# Patient Record
Sex: Male | Born: 1965 | Race: White | Hispanic: No | Smoking: Current every day smoker
Health system: Southern US, Community
[De-identification: ages and names within clinical notes are randomized; demographics above are authoritative.]

## PROBLEM LIST (undated history)

## (undated) ENCOUNTER — Ambulatory Visit (HOSPITAL_COMMUNITY): Admission: EM | Payer: No Typology Code available for payment source

## (undated) DIAGNOSIS — Z9889 Other specified postprocedural states: Secondary | ICD-10-CM

## (undated) DIAGNOSIS — R112 Nausea with vomiting, unspecified: Secondary | ICD-10-CM

## (undated) DIAGNOSIS — N189 Chronic kidney disease, unspecified: Secondary | ICD-10-CM

## (undated) DIAGNOSIS — N200 Calculus of kidney: Secondary | ICD-10-CM

## (undated) DIAGNOSIS — F1910 Other psychoactive substance abuse, uncomplicated: Secondary | ICD-10-CM

## (undated) HISTORY — PX: ANKLE SURGERY: SHX546

## (undated) HISTORY — PX: NOSE SURGERY: SHX723

## (undated) HISTORY — PX: HERNIA REPAIR: SHX51

---

## 1998-05-30 ENCOUNTER — Ambulatory Visit (HOSPITAL_COMMUNITY): Admission: RE | Admit: 1998-05-30 | Discharge: 1998-05-30 | Payer: Self-pay | Admitting: Unknown Physician Specialty

## 1998-07-15 ENCOUNTER — Emergency Department (HOSPITAL_COMMUNITY): Admission: EM | Admit: 1998-07-15 | Discharge: 1998-07-15 | Payer: Self-pay | Admitting: Emergency Medicine

## 1998-10-31 ENCOUNTER — Emergency Department (HOSPITAL_COMMUNITY): Admission: EM | Admit: 1998-10-31 | Discharge: 1998-10-31 | Payer: Self-pay

## 1999-05-14 ENCOUNTER — Emergency Department (HOSPITAL_COMMUNITY): Admission: EM | Admit: 1999-05-14 | Discharge: 1999-05-14 | Payer: Self-pay | Admitting: *Deleted

## 1999-10-07 ENCOUNTER — Emergency Department (HOSPITAL_COMMUNITY): Admission: EM | Admit: 1999-10-07 | Discharge: 1999-10-07 | Payer: Self-pay | Admitting: Emergency Medicine

## 1999-10-08 ENCOUNTER — Encounter: Payer: Self-pay | Admitting: Emergency Medicine

## 2001-12-06 ENCOUNTER — Emergency Department (HOSPITAL_COMMUNITY): Admission: EM | Admit: 2001-12-06 | Discharge: 2001-12-06 | Payer: Self-pay | Admitting: *Deleted

## 2001-12-07 ENCOUNTER — Encounter: Payer: Self-pay | Admitting: Emergency Medicine

## 2001-12-14 ENCOUNTER — Emergency Department (HOSPITAL_COMMUNITY): Admission: EM | Admit: 2001-12-14 | Discharge: 2001-12-14 | Payer: Self-pay

## 2001-12-22 ENCOUNTER — Emergency Department (HOSPITAL_COMMUNITY): Admission: EM | Admit: 2001-12-22 | Discharge: 2001-12-23 | Payer: Self-pay | Admitting: Emergency Medicine

## 2001-12-22 ENCOUNTER — Encounter: Payer: Self-pay | Admitting: *Deleted

## 2003-01-19 ENCOUNTER — Emergency Department (HOSPITAL_COMMUNITY): Admission: EM | Admit: 2003-01-19 | Discharge: 2003-01-19 | Payer: Self-pay

## 2003-04-01 ENCOUNTER — Emergency Department (HOSPITAL_COMMUNITY): Admission: EM | Admit: 2003-04-01 | Discharge: 2003-04-01 | Payer: Self-pay | Admitting: Emergency Medicine

## 2003-07-23 ENCOUNTER — Emergency Department (HOSPITAL_COMMUNITY): Admission: EM | Admit: 2003-07-23 | Discharge: 2003-07-23 | Payer: Self-pay | Admitting: Emergency Medicine

## 2003-09-27 ENCOUNTER — Emergency Department (HOSPITAL_COMMUNITY): Admission: EM | Admit: 2003-09-27 | Discharge: 2003-09-27 | Payer: Self-pay | Admitting: Emergency Medicine

## 2005-04-26 ENCOUNTER — Emergency Department (HOSPITAL_COMMUNITY): Admission: EM | Admit: 2005-04-26 | Discharge: 2005-04-26 | Payer: Self-pay | Admitting: Emergency Medicine

## 2005-06-29 ENCOUNTER — Emergency Department (HOSPITAL_COMMUNITY): Admission: EM | Admit: 2005-06-29 | Discharge: 2005-06-29 | Payer: Self-pay | Admitting: Emergency Medicine

## 2005-08-13 ENCOUNTER — Emergency Department (HOSPITAL_COMMUNITY): Admission: EM | Admit: 2005-08-13 | Discharge: 2005-08-13 | Payer: Self-pay | Admitting: Emergency Medicine

## 2005-09-04 ENCOUNTER — Emergency Department (HOSPITAL_COMMUNITY): Admission: EM | Admit: 2005-09-04 | Discharge: 2005-09-04 | Payer: Self-pay | Admitting: Emergency Medicine

## 2007-01-04 ENCOUNTER — Emergency Department (HOSPITAL_COMMUNITY): Admission: EM | Admit: 2007-01-04 | Discharge: 2007-01-04 | Payer: Self-pay | Admitting: Emergency Medicine

## 2008-04-28 ENCOUNTER — Emergency Department (HOSPITAL_COMMUNITY): Admission: EM | Admit: 2008-04-28 | Discharge: 2008-04-28 | Payer: Self-pay | Admitting: Emergency Medicine

## 2009-01-28 ENCOUNTER — Emergency Department (HOSPITAL_COMMUNITY): Admission: EM | Admit: 2009-01-28 | Discharge: 2009-01-28 | Payer: Self-pay | Admitting: Emergency Medicine

## 2009-03-24 ENCOUNTER — Emergency Department (HOSPITAL_COMMUNITY): Admission: EM | Admit: 2009-03-24 | Discharge: 2009-03-24 | Payer: Self-pay | Admitting: Emergency Medicine

## 2009-05-04 ENCOUNTER — Emergency Department (HOSPITAL_COMMUNITY): Admission: EM | Admit: 2009-05-04 | Discharge: 2009-05-04 | Payer: Self-pay | Admitting: Emergency Medicine

## 2009-07-01 ENCOUNTER — Emergency Department (HOSPITAL_COMMUNITY): Admission: EM | Admit: 2009-07-01 | Discharge: 2009-07-01 | Payer: Self-pay | Admitting: Emergency Medicine

## 2009-09-03 ENCOUNTER — Emergency Department (HOSPITAL_COMMUNITY): Admission: EM | Admit: 2009-09-03 | Discharge: 2009-09-03 | Payer: Self-pay | Admitting: Family Medicine

## 2009-09-03 ENCOUNTER — Emergency Department (HOSPITAL_COMMUNITY): Admission: EM | Admit: 2009-09-03 | Discharge: 2009-09-03 | Payer: Self-pay | Admitting: Emergency Medicine

## 2009-09-06 ENCOUNTER — Ambulatory Visit (HOSPITAL_COMMUNITY): Admission: RE | Admit: 2009-09-06 | Discharge: 2009-09-06 | Payer: Self-pay | Admitting: Emergency Medicine

## 2009-09-17 ENCOUNTER — Emergency Department (HOSPITAL_COMMUNITY): Admission: EM | Admit: 2009-09-17 | Discharge: 2009-09-17 | Payer: Self-pay | Admitting: Family Medicine

## 2009-09-28 ENCOUNTER — Ambulatory Visit (HOSPITAL_COMMUNITY): Admission: RE | Admit: 2009-09-28 | Discharge: 2009-09-28 | Payer: Self-pay | Admitting: Orthopaedic Surgery

## 2009-12-31 ENCOUNTER — Emergency Department (HOSPITAL_COMMUNITY): Admission: AC | Admit: 2009-12-31 | Discharge: 2009-12-31 | Payer: Self-pay

## 2010-01-07 ENCOUNTER — Emergency Department (HOSPITAL_COMMUNITY): Admission: EM | Admit: 2010-01-07 | Discharge: 2010-01-07 | Payer: Self-pay | Admitting: Family Medicine

## 2010-04-06 ENCOUNTER — Emergency Department (HOSPITAL_COMMUNITY): Admission: EM | Admit: 2010-04-06 | Discharge: 2010-04-06 | Payer: Self-pay | Admitting: Emergency Medicine

## 2010-05-07 ENCOUNTER — Emergency Department (HOSPITAL_COMMUNITY): Admission: EM | Admit: 2010-05-07 | Discharge: 2010-05-07 | Payer: Self-pay | Admitting: Emergency Medicine

## 2010-07-07 ENCOUNTER — Emergency Department (HOSPITAL_COMMUNITY): Admission: EM | Admit: 2010-07-07 | Discharge: 2010-07-07 | Payer: Self-pay | Admitting: Family Medicine

## 2010-07-20 ENCOUNTER — Emergency Department (HOSPITAL_COMMUNITY): Admission: EM | Admit: 2010-07-20 | Discharge: 2010-07-20 | Payer: Self-pay | Admitting: Family Medicine

## 2010-11-19 IMAGING — CT CT PELVIS W/O CM
2 of 4 series · 17 of 46 positions shown, 19 images · non-contrast
Comparison: None

CT ABDOMEN

CLINICAL DATA: *Abdominal pain; left lower quadrant pain

CT ABDOMEN AND PELVIS
TECHNIQUE: Multidetector CT imaging of the abdomen was performed
following the standard protocol without IV contrast.,Technique:
Multidetector CT imaging of the pelvis was performed following the
standard protocol without intravenous contrast.

[Series 2: a/p w/o 5.0 b31f st · axial · non-contrast · 0.76mm/px · z∈[+840,+1266]mm · 14 of 93 slices shown, 16 images]
[im 4/93  soft-tissue]
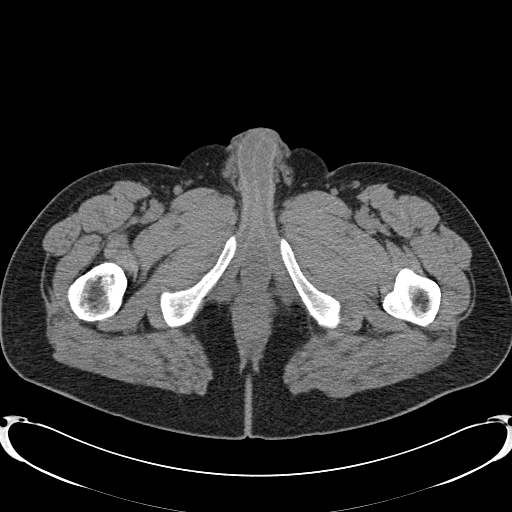
[im 4/93  bone]
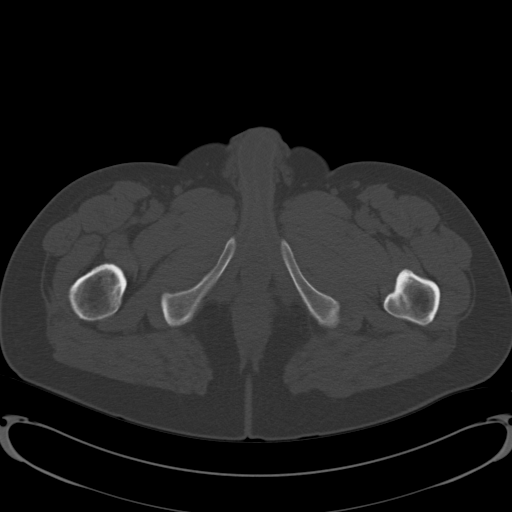
[im 12/93  soft-tissue]
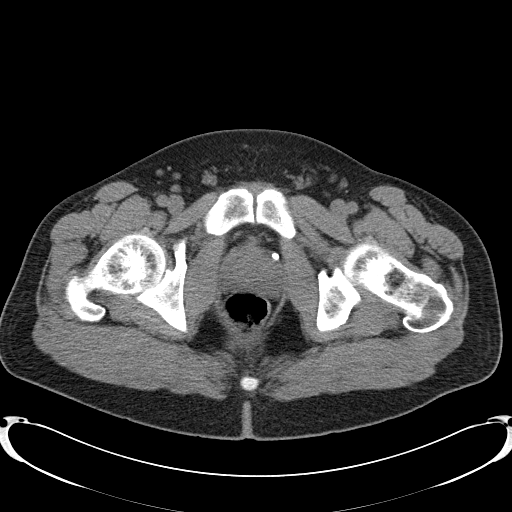
[im 19/93  soft-tissue]
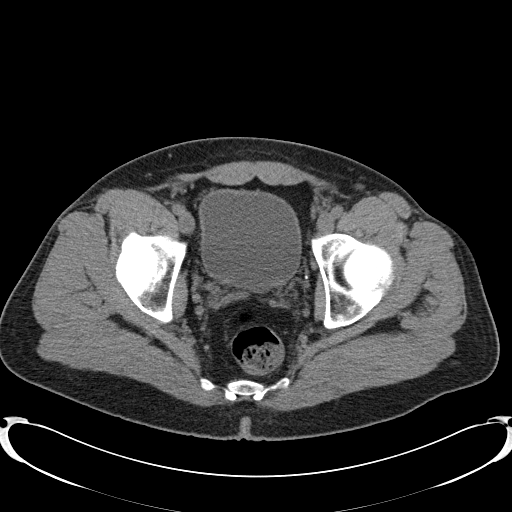
[im 26/93  soft-tissue]
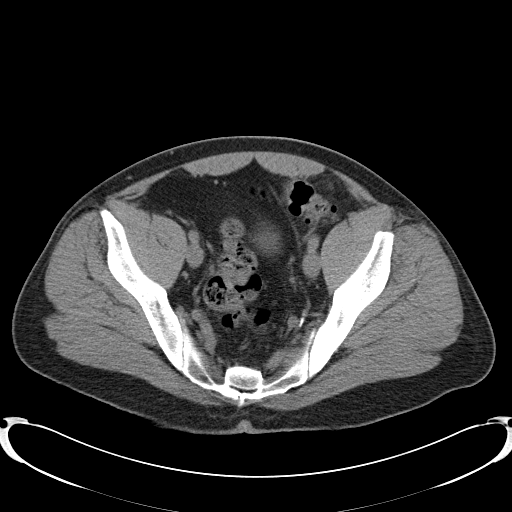
[im 30/93  soft-tissue]
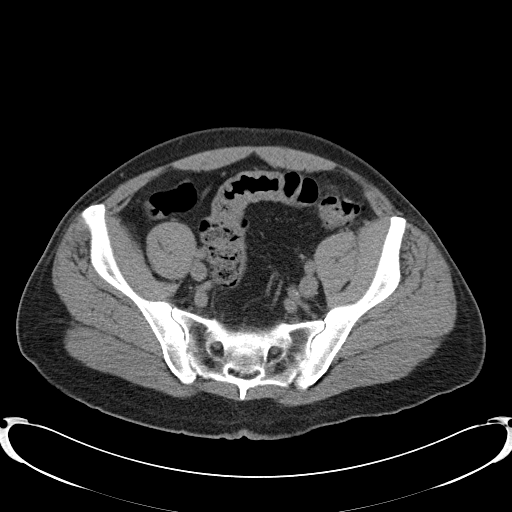
[im 37/93  soft-tissue]
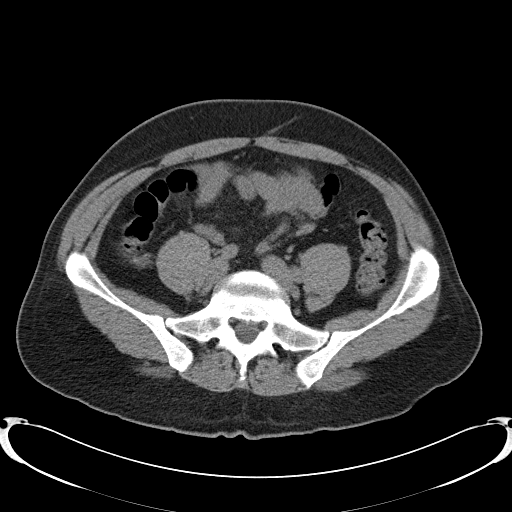
[im 45/93  soft-tissue]
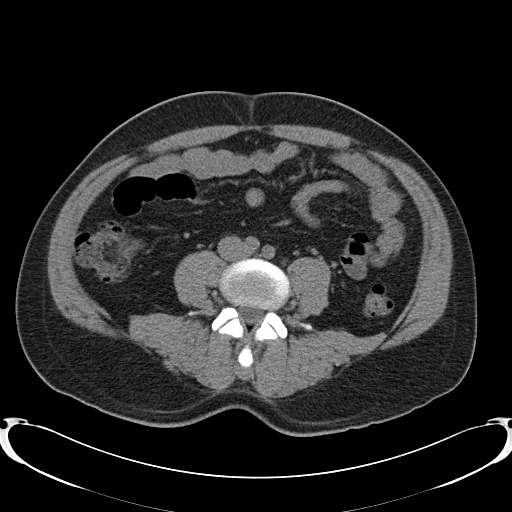
[im 48/93  soft-tissue]
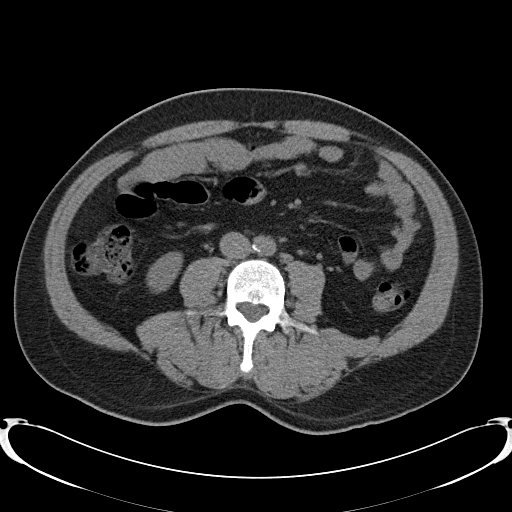
[im 56/93  soft-tissue]
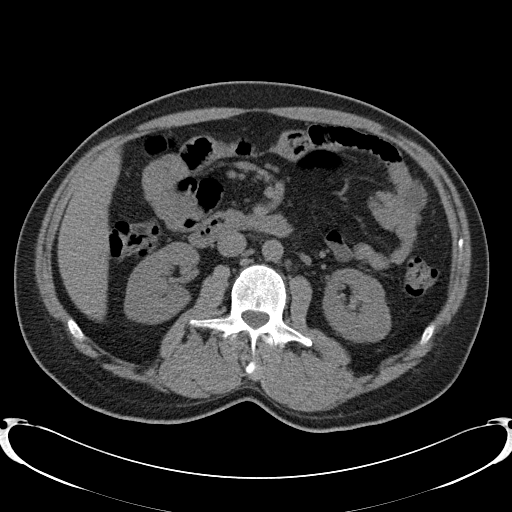
[im 56/93  bone]
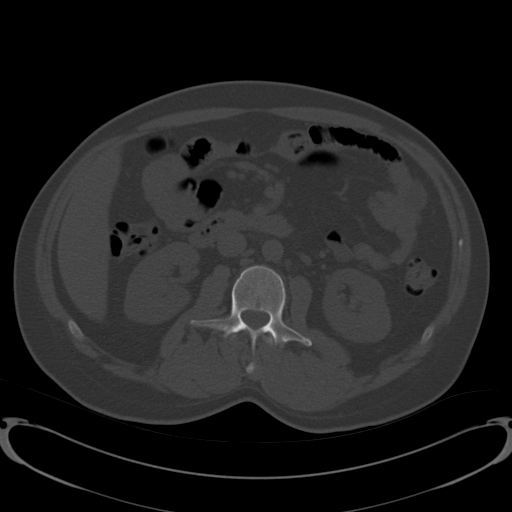
[im 63/93  soft-tissue]
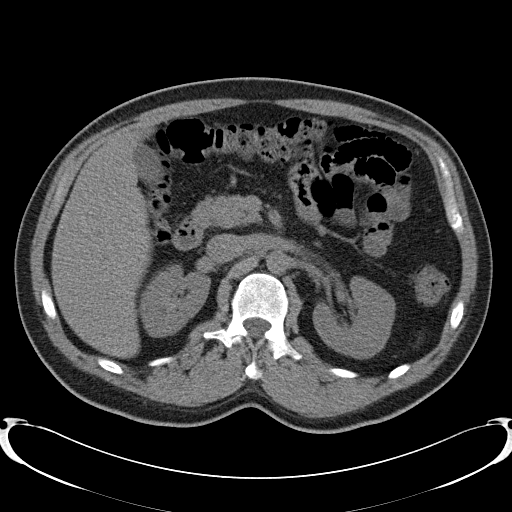
[im 70/93  soft-tissue]
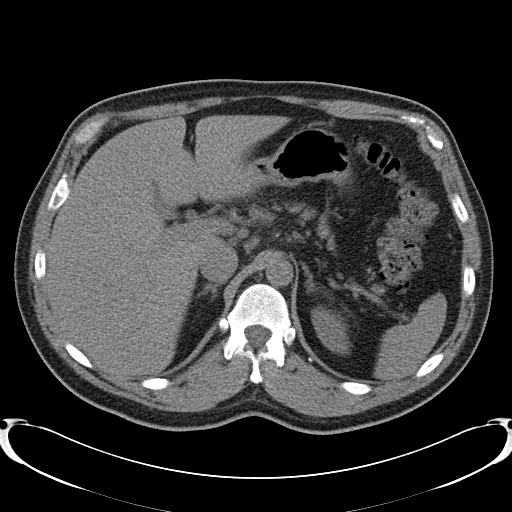
[im 74/93  soft-tissue]
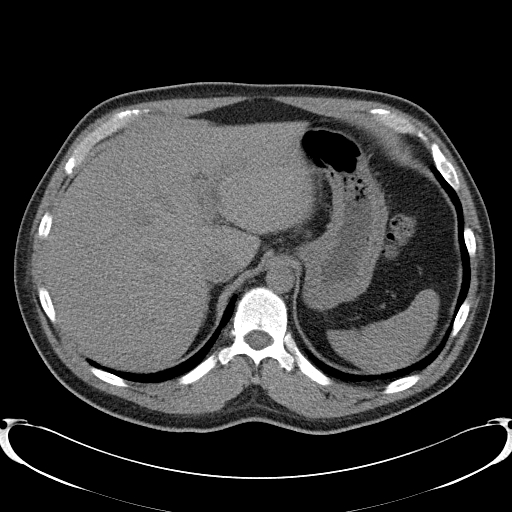
[im 81/93  soft-tissue]
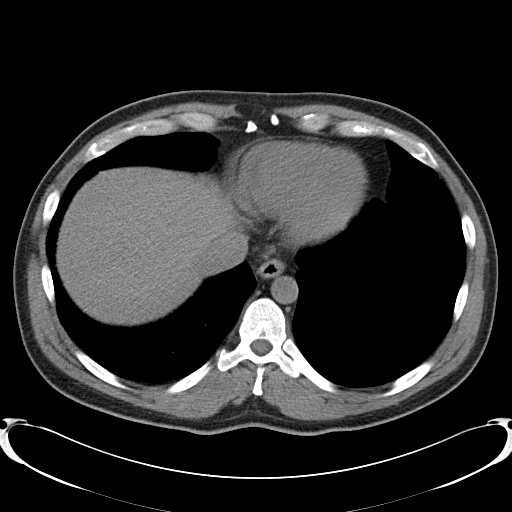
[im 89/93  soft-tissue]
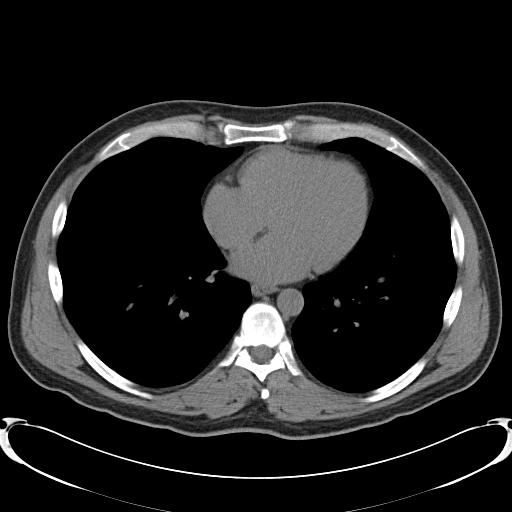

[Series 602: coronal abd/pel · coronal · 0.91mm/px · 3 of 82 slices shown]
[im 28/82  soft-tissue]
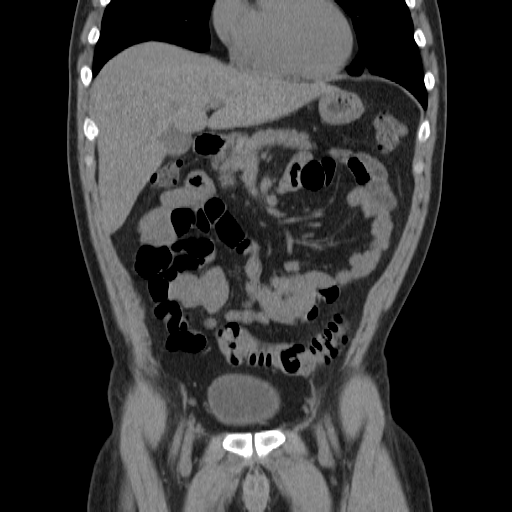
[im 37/82  soft-tissue]
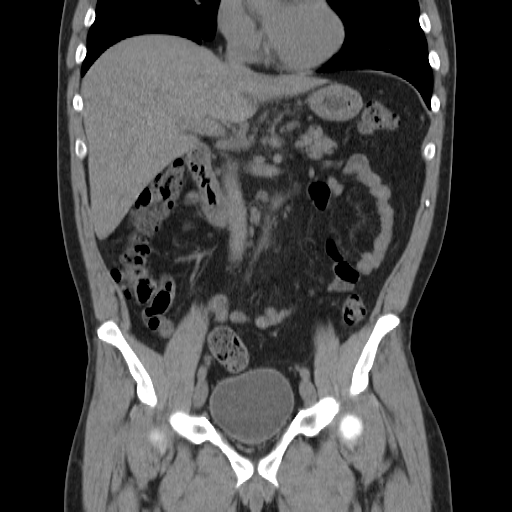
[im 46/82  soft-tissue]
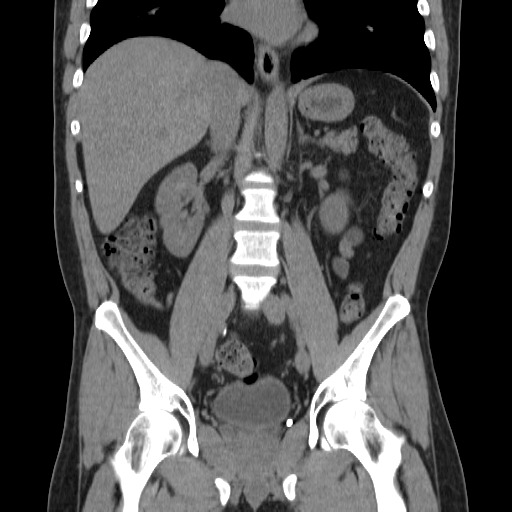

[17 of 46 positions shown; findings below may reference images not displayed]

FINDINGS: No destructive bony lesions are noted.  Mild disc space
flattening noted at L5, S1 level.

Lung bases are unremarkable.

Unenhanced liver, spleen, pancreas and adrenals are unremarkable.
No calcified gallstones are noted within gallbladder.  No bowel
obstruction.  No ascites or free air.  No adenopathy.

Unenhanced kidney are symmetrical in size.  No nephrolithiasis.  No
hydronephrosis or hydroureter.

Mild atherosclerotic calcifications noted right iliac artery
origin.
IMPRESSION: 1.  No nephrolithiasis.  No hydronephrosis or hydroureter.
2.  No bowel obstruction.  No ascites or free air.

CT PELVIS
FINDINGS: In axial image 65 there is mild stranding of the
mesenteric fat anterior to the left colon in the left lower
quadrant.  Findings are highly suspicious for epiploic appendagitis
or focal panniculitis.  Less likely diverticulitis.  No mesenteric
abscess or fluid collection. The urinary bladder is unremarkable.
Pelvic phleboliths are noted. Normal appendix is noted in axial
image 56
IMPRESSION: 1. Findings suspicious for epiploic appendagitis or focal
panniculitis in left lower quadrant mesentery anterior to the left
colon.  No mesenteric abscess or fluid collection.
2.  Unremarkable urinary bladder.

## 2011-01-26 LAB — CBC
HCT: 48.1 % (ref 39.0–52.0)
MCV: 97.1 fL (ref 78.0–100.0)
Platelets: 296 10*3/uL (ref 150–400)
RDW: 13.8 % (ref 11.5–15.5)
WBC: 11.7 10*3/uL — ABNORMAL HIGH (ref 4.0–10.5)

## 2011-01-26 LAB — TYPE AND SCREEN: ABO/RH(D): A POS

## 2011-01-26 LAB — POCT CARDIAC MARKERS: Myoglobin, poc: 251 ng/mL (ref 12–200)

## 2011-01-26 LAB — COMPREHENSIVE METABOLIC PANEL
ALT: 22 U/L (ref 0–53)
Albumin: 4 g/dL (ref 3.5–5.2)
Alkaline Phosphatase: 74 U/L (ref 39–117)
Chloride: 103 mEq/L (ref 96–112)
GFR calc Af Amer: >60 mL/min (ref >60)
GFR calc non Af Amer: >60 mL/min (ref >60)
Total Protein: 7 g/dL (ref 6.0–8.3)

## 2011-01-26 LAB — POCT I-STAT, CHEM 8
Calcium, Ion: 1.02 mmol/L — ABNORMAL LOW (ref 1.12–1.32)
HCT: 50 % (ref 39.0–52.0)
Hemoglobin: 17 g/dL (ref 13.0–17.0)
Sodium: 139 meq/L (ref 135–145)
TCO2: 24 mmol/L (ref 0–100)

## 2011-01-26 LAB — LACTIC ACID, PLASMA: Lactic Acid, Venous: 3.7 mmol/L — ABNORMAL HIGH (ref 0.5–2.2)

## 2011-01-26 LAB — APTT: aPTT: 26 seconds (ref 24–37)

## 2011-02-04 LAB — URINALYSIS, ROUTINE W REFLEX MICROSCOPIC
Bilirubin Urine: NEGATIVE
Hgb urine dipstick: NEGATIVE
Specific Gravity, Urine: >1.046 — ABNORMAL HIGH (ref 1.005–1.030)
Urobilinogen, UA: 0.2 mg/dL (ref 0.0–1.0)

## 2011-02-04 LAB — POCT I-STAT, CHEM 8
Calcium, Ion: 1.15 mmol/L (ref 1.12–1.32)
Creatinine, Ser: 1 mg/dL (ref 0.4–1.5)
Glucose, Bld: 88 mg/dL (ref 70–99)
Hemoglobin: 16.7 g/dL (ref 13.0–17.0)
Potassium: 4.2 mEq/L (ref 3.5–5.1)

## 2011-02-04 LAB — CBC
HCT: 46.3 % (ref 39.0–52.0)
Hemoglobin: 16.1 g/dL (ref 13.0–17.0)
MCHC: 34.7 g/dL (ref 30.0–36.0)
MCV: 95.7 fL (ref 78.0–100.0)
RBC: 4.84 MIL/uL (ref 4.22–5.81)

## 2011-02-04 LAB — DIFFERENTIAL
Basophils Relative: 1 % (ref 0–1)
Eosinophils Absolute: 0.3 10*3/uL (ref 0.0–0.7)
Eosinophils Relative: 3 % (ref 0–5)
Monocytes Absolute: 0.8 10*3/uL (ref 0.1–1.0)
Monocytes Relative: 9 % (ref 3–12)
Neutro Abs: 4.7 10*3/uL (ref 1.7–7.7)

## 2011-02-04 LAB — URINE CULTURE: Colony Count: NO GROWTH

## 2011-02-04 LAB — GIARDIA/CRYPTOSPORIDIUM SCREEN(EIA): Giardia Screen - EIA: NEGATIVE

## 2011-02-04 LAB — CLOSTRIDIUM DIFFICILE EIA

## 2011-02-04 LAB — STOOL CULTURE

## 2011-02-07 LAB — URINALYSIS, ROUTINE W REFLEX MICROSCOPIC
Ketones, ur: NEGATIVE mg/dL
Nitrite: NEGATIVE
Protein, ur: NEGATIVE mg/dL

## 2013-11-28 ENCOUNTER — Ambulatory Visit (HOSPITAL_COMMUNITY): Payer: Worker's Compensation | Admitting: Certified Registered Nurse Anesthetist

## 2013-11-28 ENCOUNTER — Encounter (HOSPITAL_COMMUNITY): Payer: Self-pay | Admitting: *Deleted

## 2013-11-28 ENCOUNTER — Inpatient Hospital Stay (HOSPITAL_COMMUNITY)
Admission: AD | Admit: 2013-11-28 | Discharge: 2013-11-30 | DRG: 514 | Disposition: A | Discharge status: Home / Self Care | Payer: Worker's Compensation | Source: Ambulatory Visit | Attending: Orthopedic Surgery | Admitting: Orthopedic Surgery

## 2013-11-28 ENCOUNTER — Encounter (HOSPITAL_COMMUNITY)
Admission: AD | Disposition: A | Discharge status: Home / Self Care | Payer: Self-pay | Source: Ambulatory Visit | Attending: Orthopedic Surgery

## 2013-11-28 ENCOUNTER — Encounter (HOSPITAL_COMMUNITY): Payer: Worker's Compensation | Admitting: Certified Registered Nurse Anesthetist

## 2013-11-28 DIAGNOSIS — W540XXA Bitten by dog, initial encounter: Secondary | ICD-10-CM | POA: Diagnosis present

## 2013-11-28 DIAGNOSIS — Z87891 Personal history of nicotine dependence: Secondary | ICD-10-CM

## 2013-11-28 DIAGNOSIS — S61209A Unspecified open wound of unspecified finger without damage to nail, initial encounter: Secondary | ICD-10-CM | POA: Diagnosis present

## 2013-11-28 DIAGNOSIS — M659 Unspecified synovitis and tenosynovitis, unspecified site: Principal | ICD-10-CM | POA: Diagnosis present

## 2013-11-28 DIAGNOSIS — S61409A Unspecified open wound of unspecified hand, initial encounter: Secondary | ICD-10-CM | POA: Diagnosis present

## 2013-11-28 DIAGNOSIS — Y9289 Other specified places as the place of occurrence of the external cause: Secondary | ICD-10-CM

## 2013-11-28 DIAGNOSIS — Z23 Encounter for immunization: Secondary | ICD-10-CM

## 2013-11-28 DIAGNOSIS — Y990 Civilian activity done for income or pay: Secondary | ICD-10-CM

## 2013-11-28 DIAGNOSIS — S61459A Open bite of unspecified hand, initial encounter: Secondary | ICD-10-CM | POA: Diagnosis present

## 2013-11-28 HISTORY — DX: Nausea with vomiting, unspecified: R11.2

## 2013-11-28 HISTORY — DX: Other specified postprocedural states: Z98.890

## 2013-11-28 HISTORY — DX: Calculus of kidney: N20.0

## 2013-11-28 HISTORY — PX: I & D EXTREMITY: SHX5045

## 2013-11-28 HISTORY — DX: Chronic kidney disease, unspecified: N18.9

## 2013-11-28 LAB — CBC
HEMATOCRIT: 45.9 % (ref 39.0–52.0)
HEMOGLOBIN: 16 g/dL (ref 13.0–17.0)
MCH: 31.5 pg (ref 26.0–34.0)
MCHC: 34.9 g/dL (ref 30.0–36.0)
MCV: 90.4 fL (ref 78.0–100.0)
PLATELETS: 336 10*3/uL (ref 150–400)
RBC: 5.08 MIL/uL (ref 4.22–5.81)
RDW: 12.9 % (ref 11.5–15.5)
WBC: 8 10*3/uL (ref 4.0–10.5)

## 2013-11-28 SURGERY — IRRIGATION AND DEBRIDEMENT EXTREMITY
Anesthesia: General | Site: Hand | Laterality: Left

## 2013-11-28 MED ORDER — ONDANSETRON HCL 4 MG/2ML IJ SOLN
INTRAMUSCULAR | Status: AC
Start: 2013-11-28 — End: 2013-11-28
  Filled 2013-11-28: qty 2

## 2013-11-28 MED ORDER — PROMETHAZINE HCL 25 MG RE SUPP: 12.5000 mg | Freq: Four times a day (QID) | RECTAL | Status: DC | PRN

## 2013-11-28 MED ORDER — LIDOCAINE HCL (CARDIAC) 20 MG/ML IV SOLN
INTRAVENOUS | Status: DC | PRN
  Administered 2013-11-28: 100 mg via INTRAVENOUS

## 2013-11-28 MED ORDER — FENTANYL CITRATE 0.05 MG/ML IJ SOLN
25.0000 ug | INTRAMUSCULAR | Status: AC | PRN
  Administered 2013-11-28 (×3): 25 ug via INTRAVENOUS
  Administered 2013-11-28: 50 ug via INTRAVENOUS
  Administered 2013-11-28 (×2): 25 ug via INTRAVENOUS

## 2013-11-28 MED ORDER — METHOCARBAMOL 500 MG PO TABS
500.0000 mg | ORAL_TABLET | Freq: Four times a day (QID) | ORAL | Status: DC | PRN
  Administered 2013-11-28 – 2013-11-30 (×4): 500 mg via ORAL
  Filled 2013-11-28 (×3): qty 1

## 2013-11-28 MED ORDER — PROPOFOL 10 MG/ML IV BOLUS
INTRAVENOUS | Status: DC | PRN
  Administered 2013-11-28: 200 mg via INTRAVENOUS

## 2013-11-28 MED ORDER — FENTANYL CITRATE 0.05 MG/ML IJ SOLN
INTRAMUSCULAR | Status: AC
  Filled 2013-11-28: qty 5

## 2013-11-28 MED ORDER — POTASSIUM CHLORIDE 2 MEQ/ML IV SOLN
INTRAVENOUS | Status: DC
  Administered 2013-11-28: 23:00:00 via INTRAVENOUS
  Filled 2013-11-28 (×5): qty 1000

## 2013-11-28 MED ORDER — DEXAMETHASONE SODIUM PHOSPHATE 10 MG/ML IJ SOLN
INTRAMUSCULAR | Status: DC | PRN
  Administered 2013-11-28: 8 mg via INTRAVENOUS

## 2013-11-28 MED ORDER — SUCCINYLCHOLINE CHLORIDE 20 MG/ML IJ SOLN
INTRAMUSCULAR | Status: DC | PRN
  Administered 2013-11-28: 100 mg via INTRAVENOUS

## 2013-11-28 MED ORDER — ONDANSETRON HCL 4 MG/2ML IJ SOLN: 4.0000 mg | Freq: Four times a day (QID) | INTRAMUSCULAR | Status: DC | PRN

## 2013-11-28 MED ORDER — PHENYLEPHRINE HCL 10 MG/ML IJ SOLN
INTRAMUSCULAR | Status: DC | PRN
  Administered 2013-11-28: 80 ug via INTRAVENOUS

## 2013-11-28 MED ORDER — SODIUM CHLORIDE 0.9 % IR SOLN
Status: DC | PRN
  Administered 2013-11-28: 3000 mL

## 2013-11-28 MED ORDER — PHENYLEPHRINE 40 MCG/ML (10ML) SYRINGE FOR IV PUSH (FOR BLOOD PRESSURE SUPPORT)
PREFILLED_SYRINGE | INTRAVENOUS | Status: AC
  Filled 2013-11-28: qty 10

## 2013-11-28 MED ORDER — OXYCODONE HCL 5 MG PO TABS
5.0000 mg | ORAL_TABLET | ORAL | Status: DC | PRN
  Administered 2013-11-28 – 2013-11-29 (×4): 10 mg via ORAL
  Administered 2013-11-29 (×2): 5 mg via ORAL
  Administered 2013-11-29: 10 mg via ORAL
  Filled 2013-11-28: qty 1
  Filled 2013-11-28 (×2): qty 2
  Filled 2013-11-28: qty 1
  Filled 2013-11-28 (×2): qty 2

## 2013-11-28 MED ORDER — ALPRAZOLAM 0.5 MG PO TABS
0.5000 mg | ORAL_TABLET | Freq: Four times a day (QID) | ORAL | Status: DC | PRN
  Administered 2013-11-30: 0.5 mg via ORAL
  Filled 2013-11-28: qty 1

## 2013-11-28 MED ORDER — ONDANSETRON HCL 4 MG/2ML IJ SOLN
INTRAMUSCULAR | Status: DC | PRN
  Administered 2013-11-28: 4 mg via INTRAVENOUS

## 2013-11-28 MED ORDER — SODIUM CHLORIDE 0.9 % IV SOLN
3.0000 g | Freq: Four times a day (QID) | INTRAVENOUS | Status: DC
  Administered 2013-11-29 – 2013-11-30 (×3): 3 g via INTRAVENOUS
  Filled 2013-11-28 (×6): qty 3

## 2013-11-28 MED ORDER — VITAMIN C 500 MG PO TABS
1000.0000 mg | ORAL_TABLET | Freq: Every day | ORAL | Status: DC
  Administered 2013-11-28 – 2013-11-30 (×3): 1000 mg via ORAL
  Filled 2013-11-28 (×3): qty 2

## 2013-11-28 MED ORDER — DOCUSATE SODIUM 100 MG PO CAPS
100.0000 mg | ORAL_CAPSULE | Freq: Two times a day (BID) | ORAL | Status: DC
  Administered 2013-11-28 – 2013-11-30 (×4): 100 mg via ORAL
  Filled 2013-11-28 (×5): qty 1

## 2013-11-28 MED ORDER — DEXAMETHASONE SODIUM PHOSPHATE 4 MG/ML IJ SOLN
INTRAMUSCULAR | Status: AC
  Filled 2013-11-28: qty 2

## 2013-11-28 MED ORDER — MORPHINE SULFATE 2 MG/ML IJ SOLN: 1.0000 mg | INTRAMUSCULAR | Status: DC | PRN

## 2013-11-28 MED ORDER — OXYCODONE HCL 5 MG PO TABS
ORAL_TABLET | ORAL | Status: AC
  Filled 2013-11-28: qty 2

## 2013-11-28 MED ORDER — ONDANSETRON HCL 4 MG PO TABS: 4.0000 mg | ORAL_TABLET | Freq: Four times a day (QID) | ORAL | Status: DC | PRN

## 2013-11-28 MED ORDER — FENTANYL CITRATE 0.05 MG/ML IJ SOLN
INTRAMUSCULAR | Status: AC
  Filled 2013-11-28: qty 2

## 2013-11-28 MED ORDER — METHOCARBAMOL 100 MG/ML IJ SOLN: 500.0000 mg | Freq: Four times a day (QID) | INTRAVENOUS | Status: DC | PRN

## 2013-11-28 MED ORDER — MIDAZOLAM HCL 5 MG/5ML IJ SOLN
INTRAMUSCULAR | Status: DC | PRN
  Administered 2013-11-28: 2 mg via INTRAVENOUS

## 2013-11-28 MED ORDER — FENTANYL CITRATE 0.05 MG/ML IJ SOLN
INTRAMUSCULAR | Status: DC | PRN
Start: 2013-11-28 — End: 2013-11-28
  Administered 2013-11-28 (×2): 50 ug via INTRAVENOUS
  Administered 2013-11-28: 150 ug via INTRAVENOUS

## 2013-11-28 MED ORDER — ONDANSETRON HCL 4 MG/2ML IJ SOLN
INTRAMUSCULAR | Status: AC
  Filled 2013-11-28: qty 2

## 2013-11-28 MED ORDER — MIDAZOLAM HCL 2 MG/2ML IJ SOLN
INTRAMUSCULAR | Status: AC
  Filled 2013-11-28: qty 2

## 2013-11-28 MED ORDER — FAMOTIDINE 20 MG PO TABS
20.0000 mg | ORAL_TABLET | Freq: Two times a day (BID) | ORAL | Status: DC | PRN
  Filled 2013-11-28: qty 1

## 2013-11-28 MED ORDER — METHOCARBAMOL 500 MG PO TABS
ORAL_TABLET | ORAL | Status: AC
  Filled 2013-11-28: qty 1

## 2013-11-28 MED ORDER — FENTANYL CITRATE 0.05 MG/ML IJ SOLN
25.0000 ug | INTRAMUSCULAR | Status: DC | PRN
  Administered 2013-11-28: 25 ug via INTRAVENOUS

## 2013-11-28 SURGICAL SUPPLY — 44 items
BANDAGE CONFORM 2  STR LF (GAUZE/BANDAGES/DRESSINGS) ×3 IMPLANT
BANDAGE CONFORM 3  STR LF (GAUZE/BANDAGES/DRESSINGS) ×3 IMPLANT
BANDAGE ELASTIC 3 VELCRO ST LF (GAUZE/BANDAGES/DRESSINGS) ×3 IMPLANT
BANDAGE ELASTIC 4 VELCRO ST LF (GAUZE/BANDAGES/DRESSINGS) ×3 IMPLANT
BANDAGE GAUZE ELAST BULKY 4 IN (GAUZE/BANDAGES/DRESSINGS) ×3 IMPLANT
BNDG ELASTIC 2 VLCR STRL LF (GAUZE/BANDAGES/DRESSINGS) ×3 IMPLANT
CLOTH BEACON ORANGE TIMEOUT ST (SAFETY) ×3 IMPLANT
CORDS BIPOLAR (ELECTRODE) ×3 IMPLANT
CUFF TOURNIQUET SINGLE 18IN (TOURNIQUET CUFF) ×3 IMPLANT
CUFF TOURNIQUET SINGLE 24IN (TOURNIQUET CUFF) IMPLANT
DRSG ADAPTIC 3X8 NADH LF (GAUZE/BANDAGES/DRESSINGS) ×3 IMPLANT
DRSG EMULSION OIL 3X3 NADH (GAUZE/BANDAGES/DRESSINGS) ×3 IMPLANT
ELECT REM PT RETURN 9FT ADLT (ELECTROSURGICAL)
ELECTRODE REM PT RTRN 9FT ADLT (ELECTROSURGICAL) IMPLANT
GAUZE XEROFORM 1X8 LF (GAUZE/BANDAGES/DRESSINGS) ×3 IMPLANT
GLOVE BIOGEL M STRL SZ7.5 (GLOVE) ×3 IMPLANT
GLOVE SS BIOGEL STRL SZ 8 (GLOVE) ×1 IMPLANT
GLOVE SUPERSENSE BIOGEL SZ 8 (GLOVE) ×2
GOWN SRG XL XLNG 56XLVL 4 (GOWN DISPOSABLE) ×3 IMPLANT
GOWN STRL NON-REIN LRG LVL3 (GOWN DISPOSABLE) ×9 IMPLANT
GOWN STRL NON-REIN XL XLG LVL4 (GOWN DISPOSABLE) ×6
HANDPIECE INTERPULSE COAX TIP (DISPOSABLE)
KIT BASIN OR (CUSTOM PROCEDURE TRAY) ×3 IMPLANT
KIT ROOM TURNOVER OR (KITS) ×3 IMPLANT
MANIFOLD NEPTUNE II (INSTRUMENTS) ×3 IMPLANT
NEEDLE HYPO 25GX1X1/2 BEV (NEEDLE) IMPLANT
NS IRRIG 1000ML POUR BTL (IV SOLUTION) ×3 IMPLANT
PACK ORTHO EXTREMITY (CUSTOM PROCEDURE TRAY) ×3 IMPLANT
PAD ARMBOARD 7.5X6 YLW CONV (MISCELLANEOUS) ×6 IMPLANT
PAD CAST 4YDX4 CTTN HI CHSV (CAST SUPPLIES) ×1 IMPLANT
PADDING CAST COTTON 4X4 STRL (CAST SUPPLIES) ×2
SET HNDPC FAN SPRY TIP SCT (DISPOSABLE) IMPLANT
SPONGE GAUZE 4X4 12PLY (GAUZE/BANDAGES/DRESSINGS) ×3 IMPLANT
SPONGE LAP 18X18 X RAY DECT (DISPOSABLE) ×3 IMPLANT
SPONGE LAP 4X18 X RAY DECT (DISPOSABLE) ×3 IMPLANT
SYR CONTROL 10ML LL (SYRINGE) IMPLANT
TOWEL OR 17X24 6PK STRL BLUE (TOWEL DISPOSABLE) ×3 IMPLANT
TOWEL OR 17X26 10 PK STRL BLUE (TOWEL DISPOSABLE) ×3 IMPLANT
TUBE ANAEROBIC SPECIMEN COL (MISCELLANEOUS) ×3 IMPLANT
TUBE CONNECTING 12'X1/4 (SUCTIONS) ×1
TUBE CONNECTING 12X1/4 (SUCTIONS) ×2 IMPLANT
TUBING CYSTO DISP (UROLOGICAL SUPPLIES) ×3 IMPLANT
WATER STERILE IRR 1000ML POUR (IV SOLUTION) ×3 IMPLANT
YANKAUER SUCT BULB TIP NO VENT (SUCTIONS) ×3 IMPLANT

## 2013-11-28 NOTE — Anesthesia Postprocedure Evaluation (Signed)
  Anesthesia Post-op Note  Patient: Gabriel Caldwell  Procedure(s) Performed: Procedure(s): IRRIGATION AND DEBRIDEMENT (Left)  Patient Location: PACU  Anesthesia Type:General  Level of Consciousness: awake  Airway and Oxygen Therapy: Patient Spontanous Breathing  Post-op Pain: mild  Post-op Assessment: Post-op Vital signs reviewed  Post-op Vital Signs: Reviewed  Complications: No apparent anesthesia complications 

## 2013-11-28 NOTE — Progress Notes (Signed)
Belongings taken to PACU per Mannie StabileKathy Gallman, RN

## 2013-11-28 NOTE — Preoperative (Signed)
Beta Blockers   Reason not to administer Beta Blockers:Not Applicable 

## 2013-11-28 NOTE — Op Note (Signed)
See Lenore Cordiadictation#[id number] Diarra Kos MD

## 2013-11-28 NOTE — Anesthesia Postprocedure Evaluation (Signed)
  Anesthesia Post-op Note  Patient: Gabriel Caldwell  Procedure(s) Performed: Procedure(s): IRRIGATION AND DEBRIDEMENT (Left)  Patient Location: PACU  Anesthesia Type:General  Level of Consciousness: awake  Airway and Oxygen Therapy: Patient Spontanous Breathing  Post-op Pain: mild  Post-op Assessment: Post-op Vital signs reviewed  Post-op Vital Signs: Reviewed  Complications: No apparent anesthesia complications

## 2013-11-28 NOTE — Anesthesia Procedure Notes (Signed)
Procedure Name: Intubation Date/Time: 11/28/2013 6:07 PM Performed by: Sarita HaverFLOWERS, Lidiya Reise T Pre-anesthesia Checklist: Patient identified, Timeout performed, Emergency Drugs available, Suction available and Patient being monitored Patient Re-evaluated:Patient Re-evaluated prior to inductionOxygen Delivery Method: Circle system utilized and Simple face mask Preoxygenation: Pre-oxygenation with 100% oxygen Intubation Type: IV induction Ventilation: Mask ventilation without difficulty Laryngoscope Size: Mac and 4 Grade View: Grade I Tube type: Oral Tube size: 7.5 mm Number of attempts: 1 Airway Equipment and Method: Patient positioned with wedge pillow and Stylet Placement Confirmation: ETT inserted through vocal cords under direct vision,  positive ETCO2 and breath sounds checked- equal and bilateral Secured at: 23 cm Tube secured with: Tape Dental Injury: Teeth and Oropharynx as per pre-operative assessment

## 2013-11-28 NOTE — Anesthesia Preprocedure Evaluation (Signed)
Anesthesia Evaluation  Patient identified by MRN, date of birth, ID band Patient awake    Reviewed: Allergy & Precautions, H&P , NPO status , Patient's Chart, lab work & pertinent test results  History of Anesthesia Complications (+) PONV  Airway Mallampati: II      Dental   Pulmonary former smoker,  breath sounds clear to auscultation        Cardiovascular negative cardio ROS  Rhythm:Regular Rate:Normal     Neuro/Psych    GI/Hepatic negative GI ROS,   Endo/Other  negative endocrine ROS  Renal/GU History of kidney stones     Musculoskeletal   Abdominal   Peds  Hematology   Anesthesia Other Findings   Reproductive/Obstetrics                           Anesthesia Physical Anesthesia Plan  ASA: II  Anesthesia Plan: General   Post-op Pain Management:    Induction: Intravenous  Airway Management Planned: Oral ETT  Additional Equipment:   Intra-op Plan:   Post-operative Plan: Extubation in OR  Informed Consent: I have reviewed the patients History and Physical, chart, labs and discussed the procedure including the risks, benefits and alternatives for the proposed anesthesia with the patient or authorized representative who has indicated his/her understanding and acceptance.   Dental advisory given  Plan Discussed with: CRNA and Anesthesiologist  Anesthesia Plan Comments:         Anesthesia Quick Evaluation

## 2013-11-28 NOTE — H&P (Signed)
Gabriel Caldwell is an 48 y.o. male.   Chief Complaint: Patient is a 48 year old male with infected dog bites left hand after an on the job injury  3 days ago HPI: Patient presents for evaluation left hand after being seen at a urgent care.  The patient was recently seen 2-3 days ago for evaluation of his hand. He underwent suturing and Dermabond placement about the left hand followed by Keflex administration. This was in response to a dog bite sustained on the job. The patient works as a [employer].  The patient complains of severe pain and inability to move the finger. He notes no locking popping or catching.  He denies neck back chest or abdominal pain.  [facility] was [facility] and was referred for definitive treatment.  I share concerns about closing the wound and the antibiotic (Keflex). I felt that certainly given all issues in the worsening clinical picture a irrigation and debridement would be warranted.  Past Medical History  Diagnosis Date  . PONV (postoperative nausea and vomiting)   . Chronic kidney disease   . Kidney stones     Past Surgical History  Procedure Laterality Date  . Nose surgery    . Ankle surgery Left   . Hernia repair      History reviewed. No pertinent family history. Social History:  reports that he quit smoking about 2 years ago. He does not have any smokeless tobacco history on file. He reports that he does not drink alcohol or use illicit drugs.  Allergies:  Allergies  Allergen Reactions  . Sulfa Antibiotics Hives  . Codeine Nausea And Vomiting and Other (See Comments)    Headache     Medications Prior to Admission  Medication Sig Dispense Refill  . cephALEXin (KEFLEX) 500 MG capsule Take 500 mg by mouth every 8 (eight) hours.      Marland Kitchen. ibuprofen (ADVIL,MOTRIN) 200 MG tablet Take 400 mg by mouth every 6 (six) hours as needed for moderate pain.      . meloxicam (MOBIC) 15 MG tablet Take 15 mg by mouth every morning.         Results for orders placed during the hospital encounter of 11/28/13 (from the past 48 hour(s))  CBC     Status: None   Collection Time    11/28/13  4:35 PM      Result Value Range   WBC 8.0  4.0 - 10.5 K/uL   RBC 5.08  4.22 - 5.81 MIL/uL   Hemoglobin 16.0  13.0 - 17.0 g/dL   HCT 16.145.9  09.639.0 - 04.552.0 %   MCV 90.4  78.0 - 100.0 fL   MCH 31.5  26.0 - 34.0 pg   MCHC 34.9  30.0 - 36.0 g/dL   RDW 40.912.9  81.111.5 - 91.415.5 %   Platelets 336  150 - 400 K/uL   No results found.  Review of Systems  Constitutional: Negative.   HENT: Negative.   Eyes: Negative.   Respiratory: Negative.   Cardiovascular: Negative.   Gastrointestinal: Negative.   Genitourinary: Negative.   Skin: Negative.   Neurological: Negative.   Psychiatric/Behavioral: Negative for substance abuse.       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 rate 18, height 5\' 10"  (1.778 m), weight 93.849 kg (206 lb 14.4 oz), SpO2 97.00%.  Physical Exam left hand has lacerations to the middle ring and palm regions. He cannot move these effectively. He does have intact refill and intact light touch sensation.  He notes no numbness or tingling in the elbow or wrist. He complains of severe pain with any type of motion attempted. He does have pain on passive extension and palpation of the palm and digital sheath about the ring finger.  He has no neck back chest or abdominal pain.  Marland Kitchen.The patient is alert and oriented in no acute distress the patient complains of pain in the affected upper extremity.  The patient is noted to have a normal HEENT exam.  Lung fields show equal chest expansion and no shortness of breath  abdomen exam is nontender without distention.  Lower extremity examination does not show any fracture dislocation or blood clot symptoms.  Pelvis is stable neck and back are stable and  nontender  Assessment/Plan .Marland KitchenWe are planning surgery for your upper extremity. The risk and benefits of surgery include risk of bleeding infection anesthesia damage to normal structures and failure of the surgery to accomplish its intended goals of relieving symptoms and restoring function with this in mind we'll going to proceed. I have specifically discussed with the patient the pre-and postoperative regime and the does and don'ts and risk and benefits in great detail. Risk and benefits of surgery also include risk of dystrophy chronic nerve pain failure of the healing process to go onto completion and other inherent risks of surgery The relavent the pathophysiology of the disease/injury process, as well as the alternatives for treatment and postoperative course of action has been discussed in great detail with the patient who desires to proceed.  We will do everything in our power to help you (the patient) restore function to the upper extremity. Is a pleasure to see this patient today.   Karen Chafe 11/28/2013, 5:22 PM

## 2013-11-28 NOTE — Transfer of Care (Signed)
Immediate Anesthesia Transfer of Care Note  Patient: Gabriel Caldwell  Procedure(s) Performed: Procedure(s): IRRIGATION AND DEBRIDEMENT (Left)  Patient Location: PACU  Anesthesia Type:General  Level of Consciousness: awake and alert   Airway & Oxygen Therapy: Patient Spontanous Breathing and Patient connected to nasal cannula oxygen  Post-op Assessment: Report given to PACU RN, Post -op Vital signs reviewed and stable and Patient moving all extremities X 4  Post vital signs: Reviewed and stable  Complications: No apparent anesthesia complications

## 2013-11-29 MED ORDER — TRIAMCINOLONE ACETONIDE 0.1 % EX CREA
TOPICAL_CREAM | Freq: Two times a day (BID) | CUTANEOUS | Status: DC | PRN
  Administered 2013-11-29: 1 via TOPICAL
  Administered 2013-11-30: 06:00:00 via TOPICAL
  Filled 2013-11-29: qty 15

## 2013-11-29 MED ORDER — DIPHENHYDRAMINE HCL 25 MG PO CAPS
25.0000 mg | ORAL_CAPSULE | Freq: Four times a day (QID) | ORAL | Status: DC | PRN
  Administered 2013-11-29 – 2013-11-30 (×2): 25 mg via ORAL
  Filled 2013-11-29 (×2): qty 1

## 2013-11-29 MED ORDER — ACETAMINOPHEN 500 MG PO TABS
500.0000 mg | ORAL_TABLET | Freq: Four times a day (QID) | ORAL | Status: DC | PRN
  Administered 2013-11-29: 500 mg via ORAL
  Filled 2013-11-29: qty 1

## 2013-11-29 MED ORDER — POLYETHYLENE GLYCOL 3350 17 G PO PACK
17.0000 g | PACK | Freq: Every day | ORAL | Status: DC | PRN
  Administered 2013-11-29 – 2013-11-30 (×2): 17 g via ORAL
  Filled 2013-11-29 (×2): qty 1

## 2013-11-29 MED ORDER — ACETAMINOPHEN 500 MG PO TABS
500.0000 mg | ORAL_TABLET | Freq: Four times a day (QID) | ORAL | Status: DC | PRN
  Administered 2013-11-29 – 2013-11-30 (×3): 1000 mg via ORAL
  Filled 2013-11-29 (×4): qty 2

## 2013-11-29 NOTE — Progress Notes (Signed)
UR completed. Patient changed to inpatient- requiring IV antibiotics and IVF @ 75cc/hr  

## 2013-11-29 NOTE — Evaluation (Addendum)
Occupational Therapy Evaluation Patient Details Name: Gabriel Caldwell MRN: [id number] DOB: [birth year] Today's Date: 11/29/2013 Time: [phone] OT Time Calculation (min): 39 min  OT Assessment / Plan / Recommendation History of present illness Patient is a 48 year old male with infected dog bites left hand after an on the job injury 11/26/13 now s/p I & D left hand MF/RF, volar palm on 11/28/13.   Clinical Impression   Pt presents w/ deficits in ability to use left hand functionally for ADL's, self care and job related tasks. He should benefit from out-pt OT/hand therapy for progression of HEP, edema control as well as pt/family education and scar/wound care & possible splinting PRN. Pt plans to d/c w/ PRN assist from his mother when medically able, will follow acutely.    OT Assessment  Patient needs continued OT Services;All further OT needs can be met in the next venue of care    Follow Up Recommendations  Outpatient OT;Supervision - Intermittent    Barriers to Discharge      Equipment Recommendations  None recommended by OT    Recommendations for Other Services    Frequency  Min 2X/week    Precautions / Restrictions Precautions Precautions: None Restrictions Weight Bearing Restrictions: No   Pertinent Vitals/Pain Pt reports pain left hand/palm, not rated, that increased w/ performance of HEP today. Pt requesting tylenol if possible as he reports that he's had other pain medication. RN made aware. Pt resting, left UE elevated w/ ice at conclusion of treatment session.    ADL  Eating/Feeding: Simulated;Set up Where Assessed - Eating/Feeding: Edge of bed Grooming: Performed;Wash/dry hands;Wash/dry face;Teeth care;Modified independent Where Assessed - Grooming: Unsupported standing Upper Body Bathing: Performed;Left arm;Chest;Abdomen;Modified independent (Min A bathing R UE secondary to L hand injury) Where Assessed - Upper Body Bathing: Unsupported standing Lower Body Bathing:  Performed;Modified independent Where Assessed - Lower Body Bathing: Unsupported standing Upper Body Dressing: Performed;Supervision/safety;Min guard Where Assessed - Upper Body Dressing: Unsupported standing Lower Body Dressing: Performed;Supervision/safety;Min guard Where Assessed - Lower Body Dressing: Unsupported sitting;Unsupported standing Toilet Transfer: Performed;Modified independent Toilet Transfer Method: Sit to Loss adjuster, chartered: Comfort height toilet Toileting - Clothing Manipulation and Hygiene: Performed;Modified independent Where Assessed - Toileting Clothing Manipulation and Hygiene: Standing Tub/Shower Transfer Method: Not assessed Transfers/Ambulation Related to ADLs: Pt is Mod I functional mobility and transfers in room, bathroom etc. Pt has IV pole but is Mod I w/o holding onto it. ADL Comments: Pt was educated verbally in role of OT and participated in ADL retraining session as well as instruction in HEP for Left hand A/AROM for digital extension, AROM hook, composite and flat fisting within confines of bulky dressing x10 reps ea hold x5-10 seconds ea. Pt also instructed in AROM left wrist flexion, extension, RD & UD as well as edema control techniques/elevation, cryotherapy. Pt verbalized understanding. He plans to d/c to his mothers house after d/c whom will assist PRN for ADL's.    OT Diagnosis: Generalized weakness;Acute pain  OT Problem List: Decreased range of motion;Decreased knowledge of precautions;Decreased coordination;Decreased strength;Pain;Impaired UE functional use;Increased edema OT Treatment Interventions: Self-care/ADL training;Therapeutic exercise;DME and/or AE instruction;Patient/family education;Therapeutic activities;Other (comment) (Edema control left hand)   OT Goals(Current goals can be found in the care plan section) Acute Rehab OT Goals Patient Stated Goal: Decreased pain left hand Time For Goal Achievement: 12/06/13 Potential to  Achieve Goals: Good  Visit Information  Last OT Received On: 11/29/13 History of Present Illness: Patient is a 48 year old male with infected dog bites left  hand after an on the job injury 11/26/13 now s/p I & D left hand MF/RF, volar palm on 11/28/13.       Prior [place] expects to be discharged to:: Private residence Living Arrangements: Parent Available Help at Discharge: Family;Available 24 hours/day Type of Home: House Home Access: Stairs to enter [employer] of Steps: 3 STE Home Layout: One level Home Equipment: None Prior Function Level of Independence: Independent Communication Communication: No difficulties Dominant Hand: Right    Vision/Perception Vision - History Patient Visual Report: No change from baseline   Cognition  Cognition Arousal/Alertness: Awake/alert Behavior During Therapy: WFL for tasks assessed/performed Overall Cognitive Status: Within Functional Limits for tasks assessed    Extremity/Trunk Assessment Upper Extremity Assessment Upper Extremity Assessment: LUE deficits/detail LUE Deficits / Details: Dog bite on 11/26/13, w/ I & D on 11/28/13 LUE: Unable to fully assess due to pain Lower Extremity Assessment Lower Extremity Assessment: Overall WFL for tasks assessed Cervical / Trunk Assessment Cervical / Trunk Assessment: Normal    Mobility Bed Mobility Overal bed mobility: Independent Transfers Overall transfer level: Modified independent Equipment used: None    Exercise General Exercises - Upper Extremity Wrist Flexion: AROM;Left;10 reps;Seated Wrist Extension: AROM;Left;10 reps;Seated Hand Exercises Wrist Ulnar Deviation: AROM;10 reps;Left;Seated Wrist Radial Deviation: AROM;10 reps;Left;Seated Digit Composite Flexion: AROM;Seated;Left;10 reps Composite Extension: AROM;Seated;Left;10 reps Opposition: AROM;Seated;Left;10 reps Other Exercises Other Exercises: AROM Hook fisting, composite  fisting, flat fisting, & opposition; A/AROM digital extension, AROM left wrist flexion/extension, RD/UD x10 reps ea hourly while awake w/in confines of dressing. Edema control tech's. Pt verbalized understanding and demonstrated after instruction as well.   Balance Balance Overall balance assessment: No apparent balance deficits (not formally assessed)   End of Session OT - End of Session Activity Tolerance: Patient tolerated treatment well Patient left: in bed;with call bell/phone within reach Nurse Communication: Mobility status  GO Functional Assessment Tool Used: Clinical Judgement Functional Limitation: Self care Self Care Current Status (Z3086): At least 40 percent but less than 60 percent impaired, limited or restricted Self Care Goal Status (V7846): At least 1 percent but less than 20 percent impaired, limited or restricted   Almyra Deforest 11/29/2013, 9:02 AM

## 2013-11-29 NOTE — Op Note (Signed)
NAMVivia [place]:  Masaki, Jenson              ACCOUNT NO.:  [id number]  MEDICAL RECORD NO.:  [id number]  LOCATION:  5N07C                        FACILITY:  [facility]  PHYSICIAN:  Dionne AnoWilliam M. Abbegale Stehle, M.D.DATE OF BIRTH:  [birth year]  DATE OF PROCEDURE: DATE OF DISCHARGE:                              OPERATIVE REPORT   PREOPERATIVE DIAGNOSIS:  Dog bite, left hand with involvement of the ring, middle, small and palmar regions; status post initial evaluation in an urgent care with placement of Dermabond adhesive and suturing to the palm.  The patient presents with an infection.  POSTOPERATIVE DIAGNOSIS:  Dog bite, left hand with involvement of the ring, middle, small and palmar regions; status post initial evaluation in an urgent care with placement of Dermabond adhesive and suturing to the palm. The patient presents with an infection.  Intact neurovascular bundle to the 4th webspace and intact flexor tendon apparatus.  SURGICAL PROCEDURE PERFORMED: 1. Irrigation and debridement, palm, left hand secondary to dog bite. 2. Irrigation and debridement, excisional in nature, ring finger with     removal of foreign body/Dermabond. 3. Common digital nerve and ulnar digital nerve neurolysis, extensive,     left ring finger. 4. Flexor tenosynovectomy, extensive in nature with intact tendon     noted about the ring finger, left hand, and palm.  SURGEON:  Oletta CohnWilliam M Maly Lemarr, M.D.  ASSISTANT:  None.  COMPLICATION:  None.  ANESTHESIA:  General.  TOURNIQUET TIME:  Less than 25 minutes.  INDICATIONS:  A pleasant male, 48 years of age, who was injured on the job.  A pit bull grabbed his hand and hung on tightly.  He sustained multiple bites and lacerations.  He was seen in an urgent care 2-3 days ago at [facility] and underwent placement of Keflex as well as Dermabond.  He has had progressive swelling, pain, and problems since that time.  He [facility] to [facility]  today and was referred for evaluation and treatment.  I recommended irrigation, debridement, and exploration.  The patient concurred.  OPERATION IN DETAIL:  The patient was seen by myself and Anesthesia, taken to the operative room, underwent smooth induction of general anesthesia, laid supine, fully padded, prepped and draped in usual sterile fashion with Betadine scrub and paint about the left upper extremity.  A time-out was called.  Pre and postop check was complete, and following this, we performed removal of Dermabond about the ring finger dorsally with irrigation and debridement excisional in nature with scissors, curette, and knife blade.  This was a skin and subcutaneous tissue excisional debridement.  Following this, copious amounts of saline was placed over the area.  Following this, I removed sutures about the palm and once this was done, I performed I and D and culture of the wound.  This was an excisional debridement of skin, subcutaneous tissue, and muscle less than 10 sq. cm.  The patient tolerated this well.  Cultures were taken.  Following this, I then performed extensive common digital nerve and ulnar digital nerve neurolysis to the ring finger.  This nerve was intact, and there was a lot of hematoma around it.  Following this, I performed a flexor tenosynovectomy.  There was no gross purulence, but there was tenosynovitis within the sheath system. I opened the sheath and irrigated it as well as cleaned it out.  This was a flexor tenosynovectomy of the FDP and FDS tendons of course.  Following this, I placed an additional 3 L through cysto-tubing to the hand.  I then loosely closed the palmar wound and left the dorsal ring finger open.  He will be admitted for IV Unasyn.  I have discussed all issues with he and his mother.  He was placed in a sterile bandage.  I will have OT see him for range of motion within this soft dressing.  I will begin therapy in my  office and make sure that we move him aggressively with active range of motion and other measures.  I have discussed with him the relevant issues, do's and don'ts etc.  We will look forward to participate in his care plan.  I will have him out of work until further notice of course.     Dionne Ano. Amanda Pea, M.D.     [facility]  D:  11/28/2013  T:  11/29/2013  Job:  [id number]

## 2013-11-30 ENCOUNTER — Encounter (HOSPITAL_COMMUNITY): Payer: Self-pay | Admitting: General Practice

## 2013-11-30 MED ORDER — AMOXICILLIN-POT CLAVULANATE 875-125 MG PO TABS: 1.0000 | ORAL_TABLET | Freq: Two times a day (BID) | ORAL | Status: DC

## 2013-11-30 MED ORDER — INFLUENZA VAC SPLIT QUAD 0.5 ML IM SUSP
0.5000 mL | INTRAMUSCULAR | Status: AC
  Administered 2013-11-30: 0.5 mL via INTRAMUSCULAR
  Filled 2013-11-30: qty 0.5

## 2013-11-30 NOTE — Progress Notes (Signed)
Subjective: 2 Days Post-Op Procedure(s) (LRB): IRRIGATION AND DEBRIDEMENT (Left) Patient is postop day #1  Patient reports pain as tolerable. He is in good spirits today. He denies nausea or vomiting, fever or chills. He is tolerating a regular diet.  Objective: Vital signs in last 24 hours: Temp:  [98.6 F (37 C)] 98.6 F (37 C) (01/29 0628) Pulse Rate:  [92] 92 (01/29 0628) Resp:  [18] 18 (01/29 0628) BP: (125)/(86) 125/86 mmHg (01/29 0628) SpO2:  [99 %] 99 % (01/29 0628)  Intake/Output from previous day: 01/28 0701 - 01/29 0700 In: 2376.3 [P.O.:1680; I.V.:496.3; IV Piggyback:200] Out: -  Intake/Output this shift: Total I/O In: 240 [P.O.:240] Out: -    Recent Labs  11/28/13 1635  HGB 16.0    Recent Labs  11/28/13 1635  WBC 8.0  RBC 5.08  HCT 45.9  PLT 336   No results found for this basename: NA, K, CL, CO2, BUN, CREATININE, GLUCOSE, CALCIUM,  in the last 72 hours No results found for this basename: LABPT, INR,  in the last 72 hours  The patient is pleasant in no acute distress, pleasant and interactive Focused examination of the left upper shows that his splint is clean, dry and intact. He has excellent refill about the distal tip of the fingers does have some mild aesthesias about the left ring finger not unexpected given the neuro lysis performed. He has no signs of ascending cellulitis  Assessment/Plan: 2 Days Post-Op Procedure(s) (LRB): IRRIGATION AND DEBRIDEMENT (Left) We will plan for a dressing change tomorrow and wound evaluation. During the interim I discussed with him continuation of IV antibiotics, awaiting final culture and continuing close observation. We have discussed all issues with him at length today and the patient states good understanding to the plan at hand.  Jay Haskew L 11/30/2013, 3:33 PM

## 2013-11-30 NOTE — Plan of Care (Signed)
Problem: Diagnosis - Type of Surgery Goal: General Surgical Patient Education (See Patient Education module for education specifics)  Outcome: Completed/Met Date Met:  11/30/13 I&D Left hand

## 2013-11-30 NOTE — Discharge Summary (Signed)
Physician Discharge Summary  Patient ID: CHRLES Caldwell MRN: [id number] DOB/AGE: 1966/03/21 48 y.o.  Admit date: 11/28/2013 Discharge date: 11/30/2013  Admission Diagnoses: Left hand and ring finger dog bite rule out purulent flexor tenosynovitis  Discharge Diagnoses: Status post I&D left hand and ring finger, cultures currently negative   Discharged Condition: Stable  Hospital Course: The patient is a pleasant 48 year old male who sustained an on-the-job injury resulting in a dog bite to his left hand and left ring. The Patient is employed at a kennel. He was seen and treated at a local urgent care at which juncture apparently the wound was repaired and he was placed on Keflex. He had increasing pain and swelling and presented to the [facility], at which time there was a concern for infection and questionable tendon injury. Hand surgery was consulted. Upon time of evaluation the patient had notable pain and swelling with loss of motion. We discussed with the patient ourconcerns for an early purulent flexor tenosynovitis, and recommended surgical intervention in the form of a formal I&D. Patient's preoperative laboratory data was viewed and it was cleared for surgery.  Patient was taken to the operative suite, see operative report for the full details, he tolerated the procedure without complications and was admitted to the orthopedic unit for close observation, IV antibiotics and pain management. I should note the patient was very forthcoming and stated that he did have a history of addiction and would like to avoid prolonged use of opioid medications. He did very well on Tylenol in regards to pain control. IV Unasyn was administered he tolerated this well. Intraoperative cultures were obtained. Postoperative day #2 the patient was awake, alert and oriented and in excellent spirits. We discussed all issues with him and after reviewing his cultures which showed no growth to date, most  likely skewed given his prior administration of antibiotics orally,  we discussed with him potential for discharge pending his wound conditions. The dressings were removed and his wound was evaluated I should note he had no signs of deep infection, no signs of purulent flexor tenosynovitis and in fact had marked improvement in his swelling. He of course was tender in regards to range of motion exercises that was able to elicit a full fist and extension. He did have some mild dysesthesias about the ring finger given the injury process. Given his overall improved condition decision was made to discharge him home. I discussed with the patient the need for close followup, he will see [place] at our office tomorrow we will initiate wound care and therapy , as he is very apprehensive when trying to make a full fist. He has recommended over-the-counter Tylenol as directed for pain control, in addition he will need Augmentin as an antibiotic regime.  Consults: None  Treatments: 1. Irrigation and debridement, palm, left hand secondary to dog bite.  2. Irrigation and debridement, excisional in nature, ring finger with  removal of foreign body/Dermabond.  3. Common digital nerve and ulnar digital nerve neurolysis, extensive,  left ring finger.  4. Flexor tenosynovectomy, extensive in nature with intact tendon  noted about the ring finger, left hand, and palm.  Discharge Exam: Blood pressure 125/86, pulse 92, temperature 98.6 F (37 C), temperature source Oral, resp. rate 18, height 5\' 10"  (1.778 m), weight 93.849 kg (206 lb 14.4 oz), SpO2 99.00%. Patient is extremely pleasant, in no acute distress he is alert and oriented, interactive and very conversant Head atraumatic Chest equal expansions of present  respirations are nonlabored Evaluation of the left upper extremity demonstrates  marked improvement in his soft tissue swelling and erythema his sensation is intact refill is intact no signs of deep infection or  ascending cellulitis  Disposition: Final discharge disposition not confirmed     Medication List    STOP taking these medications       cephALEXin 500 MG capsule  Commonly known as:  KEFLEX     ibuprofen 200 MG tablet  Commonly known as:  ADVIL,MOTRIN     meloxicam 15 MG tablet  Commonly known as:  MOBIC           Follow-up Information   Follow up with Karen ChafeGRAMIG III,WILLIAM M, MD. (Followup at our office tomorrow at 1pm you will see therapy first on the first floor)    Specialty:  Orthopedic Surgery   Contact information:   [address] [place] [id number] [phone]       Signed: Sheran LawlessBUCHANAN,Stela Iwasaki L 11/30/2013, 3:55 PM

## 2013-11-30 NOTE — Progress Notes (Signed)
Pt discharged home. D/c instructions given, no questions verbalized. Vitals stable. 

## 2013-11-30 NOTE — Discharge Instructions (Signed)
..Keep bandage clean and dry.  Call for any problems.  No smoking.  Criteria for driving a car: you should be off your pain medicine for 7-8 hours, able to drive one handed(confident), thinking clearly and feeling able in your judgement to drive. Continue elevation as it will decrease swelling.  If instructed by MD move your fingers within the confines of the bandage/splint.  Use ice if instructed by your MD. Call immediately for any sudden loss of feeling in your hand/arm or change in functional abilities of the extremity. Marland Kitchen.We recommend that you to take vitamin C 1000 mg a day to promote healing we also recommend that if you require her pain medicine that he take a stool softener to prevent constipation as most pain medicines will have constipation side effects. We recommend either Peri-Colace or Senokot and recommend that you also consider adding MiraLAX to prevent the constipation affects from pain medicine if you are required to use them. These medicines are over the counter and maybe purchased at a local pharmacy.  Animal Bite An animal bite can result in a scratch on the skin, deep open cut, puncture of the skin, crush injury, or tearing away of the skin or a body part. Dogs are responsible for most animal bites. Children are bitten more often than adults. An animal bite can range from very mild to more serious. A small bite from your house pet is no cause for alarm. However, some animal bites can become infected or injure a bone or other tissue. You must seek medical care if:  The skin is broken and bleeding does not slow down or stop after 15 minutes.  The puncture is deep and difficult to clean (such as a cat bite).  Pain, warmth, redness, or pus develops around the wound.  The bite is from a stray animal or rodent. There may be a risk of rabies infection.  The bite is from a snake, raccoon, skunk, fox, coyote, or bat. There may be a risk of rabies infection.  The person bitten has a  chronic illness such as diabetes, liver disease, or cancer, or the person takes medicine that lowers the immune system.  There is concern about the location and severity of the bite. It is important to clean and protect an animal bite wound right away to prevent infection. Follow these steps:  Clean the wound with plenty of water and soap.  Apply an antibiotic cream.  Apply gentle pressure over the wound with a clean towel or gauze to slow or stop bleeding.  Elevate the affected area above the heart to help stop any bleeding.  Seek medical care. Getting medical care within 8 hours of the animal bite leads to the best possible outcome. DIAGNOSIS  Your caregiver will most likely:  Take a detailed history of the animal and the bite injury.  Perform a wound exam.  Take your medical history. Blood tests or X-rays may be performed. Sometimes, infected bite wounds are cultured and sent to a lab to identify the infectious bacteria.  TREATMENT  Medical treatment will depend on the location and type of animal bite as well as the patient's medical history. Treatment may include:  Wound care, such as cleaning and flushing the wound with saline solution, bandaging, and elevating the affected area.  Antibiotics.  Tetanus immunization.  Rabies immunization.  Leaving the wound open to heal. This is often done with animal bites, due to the high risk of infection. However, in certain cases, wound closure  with stitches, wound adhesive, skin adhesive strips, or staples may be used. Infected bites that are left untreated may require intravenous (IV) antibiotics and surgical treatment in the hospital. HOME CARE INSTRUCTIONS  Follow your caregiver's instructions for wound care.  Take all medicines as directed.  If your caregiver prescribes antibiotics, take them as directed. Finish them even if you start to feel better.  Follow up with your caregiver for further exams or immunizations as  directed. You may need a tetanus shot if:  You cannot remember when you had your last tetanus shot.  You have never had a tetanus shot.  The injury broke your skin. If you get a tetanus shot, your arm may swell, get red, and feel warm to the touch. This is common and not a problem. If you need a tetanus shot and you choose not to have one, there is a rare chance of getting tetanus. Sickness from tetanus can be serious. SEEK MEDICAL CARE IF:  You notice warmth, redness, soreness, swelling, pus discharge, or a bad smell coming from the wound.  You have a red line on the skin coming from the wound.  You have a fever, chills, or a general ill feeling.  You have nausea or vomiting.  You have continued or worsening pain.  You have trouble moving the injured part.  You have other questions or concerns. MAKE SURE YOU:  Understand these instructions.  Will watch your condition.  Will get help right away if you are not doing well or get worse. Document Released: 07/07/2011 Document Revised: 01/11/2012 Document Reviewed: 07/07/2011 [facility] Patient Information 2014 [place].

## 2013-11-30 NOTE — Progress Notes (Signed)
Occupational Therapy Treatment and Discharge Patient Details Name: Gabriel Caldwell MRN: [id number] DOB: 30-Dec-1965 Today's Date: 11/30/2013 Time: 5726-2035 OT Time Calculation (min): 10 min  OT Assessment / Plan / Recommendation  History of present illness Patient is a 48 year old male with infected dog bites left hand after an on the job injury 11/26/13 now s/p I & D left hand MF/RF, volar palm on 11/28/13.   OT comments  This 48 yo male is Independent with exercises/edema control and Mod I for BADLs. Acute OT will sign off, all education completed.  Follow Up Recommendations   (per MD recommendations)       Equipment Recommendations  None recommended by OT       Frequency Min 2X/week   Progress towards OT Goals Progress towards OT goals: Goals met/education completed, patient discharged from [facility] Discharge plan remains appropriate    Precautions / Restrictions Precautions Precautions: None Restrictions Weight Bearing Restrictions: No   Pertinent Vitals/Pain Mild pain with movement in hand    ADL  ADL Comments: Wrapped pt's LUE as he would have to do at home to get it dry; also used glove to keep right hand dry due to IV.      OT Goals(current goals can now be found in the care plan section)    Visit Information  Last OT Received On: 11/30/13 Assistance Needed: +1 History of Present Illness: Patient is a 48 year old male with infected dog bites left hand after an on the job injury 11/26/13 now s/p I & D left hand MF/RF, volar palm on 11/28/13.       Prior [place] expects to be discharged to:: Private residence Living Arrangements: Parent    Cognition  Cognition Arousal/Alertness: Awake/alert Behavior During Therapy: WFL for tasks assessed/performed Overall Cognitive Status: Within Functional Limits for tasks assessed    Mobility  Bed Mobility Overal bed mobility: Independent Transfers Overall transfer level:  Independent Equipment used: None    Exercises  Other Exercises Other Exercises: Pt doing all of his exercises for digits and wrist within contstraints of wrapping and also moving elbow and shoulder to keep them from getting stiff.   Balance Balance Overall balance assessment: No apparent balance deficits (not formally assessed)  End of Session OT - End of Session Activity Tolerance: Patient tolerated treatment well Patient left:  (taking a shower)  GO Self Care Goal Status (D9741): At least 1 percent but less than 20 percent impaired, limited or restricted Self Care Discharge Status [phone]): At least 1 percent but less than 20 percent impaired, limited or restricted   Almon Register [phone] 11/30/2013, 10:38 AM

## 2013-12-01 LAB — WOUND CULTURE
Culture: NO GROWTH
GRAM STAIN: NONE SEEN

## 2013-12-03 LAB — ANAEROBIC CULTURE: Gram Stain: NONE SEEN

## 2016-01-07 ENCOUNTER — Emergency Department (HOSPITAL_COMMUNITY)
Admission: EM | Admit: 2016-01-07 | Discharge: 2016-01-07 | Disposition: A | Discharge status: Home / Self Care | Payer: No Typology Code available for payment source | Attending: Emergency Medicine | Admitting: Emergency Medicine

## 2016-01-07 ENCOUNTER — Emergency Department (HOSPITAL_COMMUNITY): Payer: No Typology Code available for payment source

## 2016-01-07 ENCOUNTER — Encounter (HOSPITAL_COMMUNITY): Payer: Self-pay | Admitting: Emergency Medicine

## 2016-01-07 DIAGNOSIS — M546 Pain in thoracic spine: Secondary | ICD-10-CM

## 2016-01-07 DIAGNOSIS — Z792 Long term (current) use of antibiotics: Secondary | ICD-10-CM | POA: Insufficient documentation

## 2016-01-07 DIAGNOSIS — Y998 Other external cause status: Secondary | ICD-10-CM | POA: Diagnosis not present

## 2016-01-07 DIAGNOSIS — Y92410 Unspecified street and highway as the place of occurrence of the external cause: Secondary | ICD-10-CM | POA: Diagnosis not present

## 2016-01-07 DIAGNOSIS — Y9389 Activity, other specified: Secondary | ICD-10-CM | POA: Insufficient documentation

## 2016-01-07 DIAGNOSIS — Z87442 Personal history of urinary calculi: Secondary | ICD-10-CM | POA: Insufficient documentation

## 2016-01-07 DIAGNOSIS — N189 Chronic kidney disease, unspecified: Secondary | ICD-10-CM | POA: Insufficient documentation

## 2016-01-07 DIAGNOSIS — S299XXA Unspecified injury of thorax, initial encounter: Secondary | ICD-10-CM | POA: Diagnosis not present

## 2016-01-07 DIAGNOSIS — Z87891 Personal history of nicotine dependence: Secondary | ICD-10-CM | POA: Insufficient documentation

## 2016-01-07 HISTORY — DX: Other psychoactive substance abuse, uncomplicated: F19.10

## 2016-01-07 MED ORDER — ACETAMINOPHEN 325 MG PO TABS
650.0000 mg | ORAL_TABLET | Freq: Once | ORAL | Status: AC
  Administered 2016-01-07: 650 mg via ORAL
  Filled 2016-01-07: qty 2

## 2016-01-07 MED ORDER — METHOCARBAMOL 500 MG PO TABS: 500.0000 mg | ORAL_TABLET | Freq: Two times a day (BID) | ORAL | Status: DC

## 2016-01-07 MED ORDER — METHOCARBAMOL 500 MG PO TABS
500.0000 mg | ORAL_TABLET | Freq: Once | ORAL | Status: AC
  Administered 2016-01-07: 500 mg via ORAL
  Filled 2016-01-07: qty 1

## 2016-01-07 MED ORDER — IBUPROFEN 800 MG PO TABS: 800.0000 mg | ORAL_TABLET | Freq: Three times a day (TID) | ORAL | Status: DC
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
## 2018-04-22 ENCOUNTER — Inpatient Hospital Stay (HOSPITAL_COMMUNITY)
Admission: EM | Admit: 2018-04-22 | Discharge: 2018-04-23 | DRG: 100 | Discharge status: Against Medical Advice | Payer: No Typology Code available for payment source | Attending: General Surgery | Admitting: General Surgery

## 2018-04-22 ENCOUNTER — Encounter (HOSPITAL_COMMUNITY): Payer: Self-pay

## 2018-04-22 ENCOUNTER — Emergency Department (HOSPITAL_COMMUNITY): Payer: No Typology Code available for payment source

## 2018-04-22 ENCOUNTER — Inpatient Hospital Stay (HOSPITAL_COMMUNITY): Payer: No Typology Code available for payment source

## 2018-04-22 DIAGNOSIS — R402312 Coma scale, best motor response, none, at arrival to emergency department: Secondary | ICD-10-CM | POA: Diagnosis present

## 2018-04-22 DIAGNOSIS — J9601 Acute respiratory failure with hypoxia: Secondary | ICD-10-CM | POA: Diagnosis present

## 2018-04-22 DIAGNOSIS — S27322A Contusion of lung, bilateral, initial encounter: Secondary | ICD-10-CM | POA: Diagnosis present

## 2018-04-22 DIAGNOSIS — G40901 Epilepsy, unspecified, not intractable, with status epilepticus: Secondary | ICD-10-CM | POA: Diagnosis present

## 2018-04-22 DIAGNOSIS — R402132 Coma scale, eyes open, to sound, at arrival to emergency department: Secondary | ICD-10-CM | POA: Diagnosis present

## 2018-04-22 DIAGNOSIS — M79642 Pain in left hand: Secondary | ICD-10-CM

## 2018-04-22 DIAGNOSIS — T1490XA Injury, unspecified, initial encounter: Secondary | ICD-10-CM | POA: Diagnosis present

## 2018-04-22 DIAGNOSIS — R402212 Coma scale, best verbal response, none, at arrival to emergency department: Secondary | ICD-10-CM | POA: Diagnosis present

## 2018-04-22 DIAGNOSIS — R569 Unspecified convulsions: Secondary | ICD-10-CM | POA: Diagnosis present

## 2018-04-22 DIAGNOSIS — Y92410 Unspecified street and highway as the place of occurrence of the external cause: Secondary | ICD-10-CM | POA: Diagnosis not present

## 2018-04-22 LAB — PROTIME-INR
INR: 1.04
Prothrombin Time: 13.6 seconds (ref 11.4–15.2)

## 2018-04-22 LAB — COMPREHENSIVE METABOLIC PANEL
ALBUMIN: 4 g/dL (ref 3.5–5.0)
ALT: 17 U/L (ref 17–63)
ANION GAP: 11 (ref 5–15)
AST: 16 U/L (ref 15–41)
Alkaline Phosphatase: 70 U/L (ref 38–126)
BILIRUBIN TOTAL: 0.7 mg/dL (ref 0.3–1.2)
BUN: 10 mg/dL (ref 6–20)
CO2: 23 mmol/L (ref 22–32)
Calcium: 8.9 mg/dL (ref 8.9–10.3)
Chloride: 105 mmol/L (ref 101–111)
Creatinine, Ser: 1.1 mg/dL (ref 0.61–1.24)
GFR calc Af Amer: >60 mL/min (ref >60)
GFR calc non Af Amer: >60 mL/min (ref >60)
GLUCOSE: 121 mg/dL — AB (ref 65–99)
POTASSIUM: 3.5 mmol/L (ref 3.5–5.1)
Sodium: 139 mmol/L (ref 135–145)
TOTAL PROTEIN: 6.5 g/dL (ref 6.5–8.1)

## 2018-04-22 LAB — BPAM FFP
Blood Product Expiration Date: 201906232359
Blood Product Expiration Date: 201906232359
ISSUE DATE / TIME: 201906210408
ISSUE DATE / TIME: 201906210408
UNIT TYPE AND RH: 6200
UNIT TYPE AND RH: 6200

## 2018-04-22 LAB — URINALYSIS, ROUTINE W REFLEX MICROSCOPIC
Bacteria, UA: NONE SEEN
Bilirubin Urine: NEGATIVE
GLUCOSE, UA: NEGATIVE mg/dL
Hgb urine dipstick: NEGATIVE
Ketones, ur: NEGATIVE mg/dL
Leukocytes, UA: NEGATIVE
Nitrite: NEGATIVE
PROTEIN: 30 mg/dL — AB
Specific Gravity, Urine: >1.046 — ABNORMAL HIGH (ref 1.005–1.030)
pH: 5 (ref 5.0–8.0)

## 2018-04-22 LAB — TYPE AND SCREEN
ABO/RH(D): A POS
Antibody Screen: NEGATIVE
UNIT DIVISION: 0
Unit division: 0

## 2018-04-22 LAB — I-STAT CHEM 8, ED
BUN: 12 mg/dL (ref 6–20)
CREATININE: 1 mg/dL (ref 0.61–1.24)
Calcium, Ion: 1.13 mmol/L — ABNORMAL LOW (ref 1.15–1.40)
Chloride: 104 mmol/L (ref 101–111)
Glucose, Bld: 122 mg/dL — ABNORMAL HIGH (ref 65–99)
HEMATOCRIT: 53 % — AB (ref 39.0–52.0)
HEMOGLOBIN: 18 g/dL — AB (ref 13.0–17.0)
Potassium: 3.5 mmol/L (ref 3.5–5.1)
SODIUM: 141 mmol/L (ref 135–145)
TCO2: 24 mmol/L (ref 22–32)

## 2018-04-22 LAB — ETHANOL: Alcohol, Ethyl (B): <10 mg/dL (ref <10)

## 2018-04-22 LAB — PREPARE FRESH FROZEN PLASMA: UNIT DIVISION: 0

## 2018-04-22 LAB — BPAM RBC
Blood Product Expiration Date: 201907052359
Blood Product Expiration Date: 201907162359
ISSUE DATE / TIME: 201906210407
ISSUE DATE / TIME: 201906210407
UNIT TYPE AND RH: 9500
Unit Type and Rh: 9500

## 2018-04-22 LAB — RAPID URINE DRUG SCREEN, HOSP PERFORMED
Amphetamines: POSITIVE — AB
BENZODIAZEPINES: POSITIVE — AB
COCAINE: POSITIVE — AB
OPIATES: NOT DETECTED
TETRAHYDROCANNABINOL: NOT DETECTED

## 2018-04-22 LAB — CBC
HEMATOCRIT: 52 % (ref 39.0–52.0)
HEMOGLOBIN: 17.1 g/dL — AB (ref 13.0–17.0)
MCH: 31.4 pg (ref 26.0–34.0)
MCHC: 32.9 g/dL (ref 30.0–36.0)
MCV: 95.4 fL (ref 78.0–100.0)
Platelets: 286 10*3/uL (ref 150–400)
RBC: 5.45 MIL/uL (ref 4.22–5.81)
RDW: 13 % (ref 11.5–15.5)
WBC: 9.1 10*3/uL (ref 4.0–10.5)

## 2018-04-22 LAB — I-STAT ARTERIAL BLOOD GAS, ED
ACID-BASE DEFICIT: 3 mmol/L — AB (ref 0.0–2.0)
BICARBONATE: 22.2 mmol/L (ref 20.0–28.0)
O2 Saturation: 96 %
PO2 ART: 85 mmHg (ref 83.0–108.0)
TCO2: 23 mmol/L (ref 22–32)
pCO2 arterial: 39.2 mmHg (ref 32.0–48.0)
pH, Arterial: 7.358 (ref 7.350–7.450)

## 2018-04-22 LAB — BLOOD PRODUCT ORDER (VERBAL) VERIFICATION

## 2018-04-22 LAB — TRIGLYCERIDES: Triglycerides: 122 mg/dL (ref <150)

## 2018-04-22 LAB — I-STAT CG4 LACTIC ACID, ED: LACTIC ACID, VENOUS: 1.84 mmol/L (ref 0.5–1.9)

## 2018-04-22 LAB — HIV ANTIBODY (ROUTINE TESTING W REFLEX): HIV SCREEN 4TH GENERATION: NONREACTIVE

## 2018-04-22 LAB — MRSA PCR SCREENING: MRSA by PCR: NEGATIVE

## 2018-04-22 MED ORDER — FAMOTIDINE IN NACL 20-0.9 MG/50ML-% IV SOLN
20.0000 mg | INTRAVENOUS | Status: DC
  Administered 2018-04-22: 20 mg via INTRAVENOUS
  Filled 2018-04-22: qty 50

## 2018-04-22 MED ORDER — ONDANSETRON 4 MG PO TBDP: 4.0000 mg | ORAL_TABLET | Freq: Four times a day (QID) | ORAL | Status: DC | PRN

## 2018-04-22 MED ORDER — LORAZEPAM 2 MG/ML IJ SOLN
1.0000 mg | Freq: Four times a day (QID) | INTRAMUSCULAR | Status: DC | PRN
  Administered 2018-04-22: 1 mg via INTRAVENOUS
  Filled 2018-04-22: qty 1

## 2018-04-22 MED ORDER — LEVETIRACETAM IN NACL 1000 MG/100ML IV SOLN: 1000.0000 mg | Freq: Once | INTRAVENOUS | Status: DC

## 2018-04-22 MED ORDER — ADULT MULTIVITAMIN W/MINERALS CH: 1.0000 | ORAL_TABLET | Freq: Every day | ORAL | Status: DC

## 2018-04-22 MED ORDER — PHENOL 1.4 % MT LIQD: 1.0000 | OROMUCOSAL | Status: DC | PRN

## 2018-04-22 MED ORDER — FENTANYL CITRATE (PF) 100 MCG/2ML IJ SOLN: 50.0000 ug | Freq: Once | INTRAMUSCULAR | Status: DC

## 2018-04-22 MED ORDER — MIDAZOLAM HCL 2 MG/2ML IJ SOLN
INTRAMUSCULAR | Status: AC
  Filled 2018-04-22: qty 4

## 2018-04-22 MED ORDER — ACETAMINOPHEN 325 MG PO TABS: 650.0000 mg | ORAL_TABLET | ORAL | Status: DC | PRN

## 2018-04-22 MED ORDER — LORAZEPAM 1 MG PO TABS: 1.0000 mg | ORAL_TABLET | Freq: Four times a day (QID) | ORAL | Status: DC | PRN

## 2018-04-22 MED ORDER — MIDAZOLAM HCL 2 MG/2ML IJ SOLN
2.0000 mg | INTRAMUSCULAR | Status: DC | PRN
  Administered 2018-04-22: 2 mg via INTRAVENOUS

## 2018-04-22 MED ORDER — PROPOFOL 1000 MG/100ML IV EMUL
0.0000 ug/kg/min | INTRAVENOUS | Status: DC
  Administered 2018-04-22: 50 ug/kg/min via INTRAVENOUS
  Filled 2018-04-22: qty 100

## 2018-04-22 MED ORDER — DOCUSATE SODIUM 100 MG PO CAPS
100.0000 mg | ORAL_CAPSULE | Freq: Two times a day (BID) | ORAL | Status: DC
  Filled 2018-04-22: qty 1

## 2018-04-22 MED ORDER — THIAMINE HCL 100 MG/ML IJ SOLN
100.0000 mg | Freq: Every day | INTRAMUSCULAR | Status: DC
  Administered 2018-04-22: 100 mg via INTRAVENOUS
  Filled 2018-04-22: qty 2

## 2018-04-22 MED ORDER — LEVETIRACETAM IN NACL 500 MG/100ML IV SOLN: 500.0000 mg | Freq: Two times a day (BID) | INTRAVENOUS | Status: DC

## 2018-04-22 MED ORDER — MIDAZOLAM HCL 5 MG/5ML IJ SOLN
INTRAMUSCULAR | Status: AC | PRN
  Administered 2018-04-22: 4 mg via INTRAVENOUS

## 2018-04-22 MED ORDER — LEVETIRACETAM 500 MG/5ML IV SOLN
750.0000 mg | Freq: Two times a day (BID) | INTRAVENOUS | Status: DC
Start: 2018-04-22 — End: 2018-04-23
  Administered 2018-04-22: 750 mg via INTRAVENOUS
  Filled 2018-04-22 (×2): qty 7.5

## 2018-04-22 MED ORDER — VITAMIN B-1 100 MG PO TABS
100.0000 mg | ORAL_TABLET | Freq: Every day | ORAL | Status: DC
Start: 2018-04-22 — End: 2018-04-23

## 2018-04-22 MED ORDER — SODIUM CHLORIDE 0.9 % IV SOLN
INTRAVENOUS | Status: DC
  Administered 2018-04-22: 09:00:00 via INTRAVENOUS

## 2018-04-22 MED ORDER — PROPOFOL 1000 MG/100ML IV EMUL
INTRAVENOUS | Status: AC | PRN
  Administered 2018-04-22: 20 ug/kg/min via INTRAVENOUS

## 2018-04-22 MED ORDER — MIDAZOLAM HCL 2 MG/2ML IJ SOLN
2.0000 mg | INTRAMUSCULAR | Status: DC | PRN
Start: 2018-04-22 — End: 2018-04-23

## 2018-04-22 MED ORDER — CHLORHEXIDINE GLUCONATE 0.12% ORAL RINSE (MEDLINE KIT)
15.0000 mL | Freq: Two times a day (BID) | OROMUCOSAL | Status: DC
  Administered 2018-04-22: 15 mL via OROMUCOSAL

## 2018-04-22 MED ORDER — ENOXAPARIN SODIUM 40 MG/0.4ML ~~LOC~~ SOLN
40.0000 mg | SUBCUTANEOUS | Status: DC
Start: 2018-04-22 — End: 2018-04-23
  Administered 2018-04-22: 40 mg via SUBCUTANEOUS
  Filled 2018-04-22: qty 0.4

## 2018-04-22 MED ORDER — ETOMIDATE 2 MG/ML IV SOLN
INTRAVENOUS | Status: AC | PRN
  Administered 2018-04-22: 30 mg via INTRAVENOUS

## 2018-04-22 MED ORDER — SUCCINYLCHOLINE CHLORIDE 20 MG/ML IJ SOLN
INTRAMUSCULAR | Status: AC | PRN
  Administered 2018-04-22: 100 mg via INTRAVENOUS

## 2018-04-22 MED ORDER — PROPOFOL 1000 MG/100ML IV EMUL
INTRAVENOUS | Status: AC
  Filled 2018-04-22: qty 100

## 2018-04-22 MED ORDER — LEVETIRACETAM IN NACL 1000 MG/100ML IV SOLN
1000.0000 mg | INTRAVENOUS | Status: AC
  Administered 2018-04-22: 1000 mg via INTRAVENOUS
  Filled 2018-04-22: qty 100

## 2018-04-22 MED ORDER — HYDRALAZINE HCL 20 MG/ML IJ SOLN: 10.0000 mg | INTRAMUSCULAR | Status: DC | PRN

## 2018-04-22 MED ORDER — PROPOFOL 1000 MG/100ML IV EMUL
0.0000 ug/kg/min | INTRAVENOUS | Status: DC
  Administered 2018-04-22: 20 ug/kg/min via INTRAVENOUS
  Filled 2018-04-22: qty 100

## 2018-04-22 MED ORDER — ONDANSETRON HCL 4 MG/2ML IJ SOLN: 4.0000 mg | Freq: Four times a day (QID) | INTRAMUSCULAR | Status: DC | PRN

## 2018-04-22 MED ORDER — IOHEXOL 300 MG/ML  SOLN
100.0000 mL | Freq: Once | INTRAMUSCULAR | Status: AC | PRN
  Administered 2018-04-22: 100 mL via INTRAVENOUS

## 2018-04-22 MED ORDER — FOLIC ACID 1 MG PO TABS: 1.0000 mg | ORAL_TABLET | Freq: Every day | ORAL | Status: DC

## 2018-04-22 MED ORDER — SODIUM CHLORIDE 0.9 % IV BOLUS
500.0000 mL | Freq: Once | INTRAVENOUS | Status: AC
  Administered 2018-04-22: 500 mL via INTRAVENOUS

## 2018-04-22 MED ORDER — ORAL CARE MOUTH RINSE
15.0000 mL | OROMUCOSAL | Status: DC
  Administered 2018-04-22 (×4): 15 mL via OROMUCOSAL

## 2018-04-22 MED ORDER — OXYCODONE HCL 5 MG PO TABS
5.0000 mg | ORAL_TABLET | ORAL | Status: DC | PRN
  Administered 2018-04-22: 5 mg via ORAL
  Filled 2018-04-22: qty 1

## 2018-04-22 MED ORDER — FENTANYL 2500MCG IN NS 250ML (10MCG/ML) PREMIX INFUSION
25.0000 ug/h | INTRAVENOUS | Status: DC
  Administered 2018-04-22: 50 ug/h via INTRAVENOUS
  Filled 2018-04-22: qty 250

## 2018-04-22 MED ORDER — FENTANYL CITRATE (PF) 100 MCG/2ML IJ SOLN
25.0000 ug | INTRAMUSCULAR | Status: DC | PRN
Start: 2018-04-22 — End: 2018-04-23

## 2018-04-22 MED ORDER — FENTANYL BOLUS VIA INFUSION
50.0000 ug | INTRAVENOUS | Status: DC | PRN
  Filled 2018-04-22: qty 50

## 2018-04-22 MED ORDER — MIDAZOLAM HCL 2 MG/2ML IJ SOLN
INTRAMUSCULAR | Status: AC
  Filled 2018-04-22: qty 2

## 2018-04-22 NOTE — Progress Notes (Signed)
Patient ID: Hervey ArdDaniel M Janoski, male   DOB: 07/08/1966, 52 y.o.   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 C/P L hand pain - will get x-ray His mother reports he has been having episodes where he is awake but "out of it". These have been happening with increasing frequency. Concern for pre-trauma absence SZ - I will notify neurology  Patient examined and I agree with the assessment and plan  Violeta GelinasBurke Zianna Dercole, MD, MPH, FACS Trauma: [phone] General Surgery: [phone]  04/22/2018 12:54 PM

## 2018-04-22 NOTE — Progress Notes (Signed)
Pt transported from ER TR[facility] to 4N 27 on 100% FIO2 with not complications.

## 2018-04-22 NOTE — Progress Notes (Signed)
Pt transported from ER TRAB to CT and back on 100% FIO2 with no complications. VS stale throughout.

## 2018-04-22 NOTE — Progress Notes (Signed)
EEG Completed; Results Pending  
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
## 2018-04-22 NOTE — Progress Notes (Signed)
Patient more awake with sedation turned completely off. Patient weaned 5/5 for several minutes and is wide awake with Dr. Janee Mornhompson at bedside. Patient extubated per Dr. Janee Mornhompson at 1235 to 4L nasal cannula. Patient tolerated extubation well. RT will continue to monitor.

## 2018-04-22 NOTE — Progress Notes (Addendum)
Interim note  Patient following commands. Spot EEG done- will follow up results Wean sedation as possible. Will have him on keppra 750mg  BID fro maintenance. Was informed by Dr Laurell JosephsBurke, that he has been having frequent episodes of staring off into space.
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
## 2018-04-22 NOTE — ED Notes (Signed)
Pt not tolerating vent, attempting to extubate self, increased propofol
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
## 2018-04-22 NOTE — Progress Notes (Signed)
Orders in to wean/extubate. RT attempted to wean patient twice on CPAP/PS 5/5. Patient had no respiratory effort both times. RT will continue to try.
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
## 2018-04-22 NOTE — Progress Notes (Signed)
Spoke with patients RN, sedation has been turned down and wean was attempted again. CPAP/PS 5/5. Patient again had very little effort with respiratory rate of 6 and 7. RN to speak with MD about plan as patient is very agitated when woken. RT will continue to monitor.

## 2018-04-23 LAB — GLUCOSE, CAPILLARY: GLUCOSE-CAPILLARY: 94 mg/dL (ref 65–99)

## 2018-04-23 LAB — CDS SEROLOGY

## 2018-04-23 LAB — BASIC METABOLIC PANEL
ANION GAP: 6 (ref 5–15)
BUN: 8 mg/dL (ref 6–20)
CO2: 25 mmol/L (ref 22–32)
Calcium: 8.5 mg/dL — ABNORMAL LOW (ref 8.9–10.3)
Chloride: 108 mmol/L (ref 101–111)
Creatinine, Ser: 0.88 mg/dL (ref 0.61–1.24)
GLUCOSE: 96 mg/dL (ref 65–99)
POTASSIUM: 4 mmol/L (ref 3.5–5.1)
Sodium: 139 mmol/L (ref 135–145)

## 2018-04-23 LAB — CBC
HCT: 47.3 % (ref 39.0–52.0)
Hemoglobin: 15.1 g/dL (ref 13.0–17.0)
MCH: 30.9 pg (ref 26.0–34.0)
MCHC: 31.9 g/dL (ref 30.0–36.0)
MCV: 96.7 fL (ref 78.0–100.0)
PLATELETS: 238 10*3/uL (ref 150–400)
RBC: 4.89 MIL/uL (ref 4.22–5.81)
RDW: 13.2 % (ref 11.5–15.5)
WBC: 9.2 10*3/uL (ref 4.0–10.5)

## 2018-04-23 LAB — CK: Total CK: 69 U/L (ref 49–397)

## 2018-04-23 NOTE — Progress Notes (Signed)
Pt very irritable and agitated this morning, he complains that the breakfast tray is taking too long to arrive to his room. RN attempted to reassure pt and offer snacks from unit stock until breakfast trays arrive, pt refused. Pt began pulling out his peripheral IVs and removed clothing. Pt states that he is leaving immediately. RN encouraged the pt to wait and spoke with pt mother this morning who was on her way to the hospital. Pt was adamant about leaving AMA so RN helped pt fill out AMA paperwork. Pt signed the form, left with his shoes only and threw away his torn clothing. MD made aware of the situation.
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
## 2018-04-24 ENCOUNTER — Encounter (HOSPITAL_COMMUNITY): Payer: Self-pay | Admitting: Emergency Medicine
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
## 2020-02-12 ENCOUNTER — Encounter: Payer: Self-pay | Admitting: Urology

## 2020-02-12 NOTE — Progress Notes (Unsigned)
Opened encounter to reviewed PMDP.

## 2020-06-06 ENCOUNTER — Other Ambulatory Visit: Payer: Self-pay

## 2020-06-06 ENCOUNTER — Emergency Department (HOSPITAL_COMMUNITY)
Admission: EM | Admit: 2020-06-06 | Discharge: 2020-06-06 | Disposition: A | Discharge status: Home / Self Care | Payer: Self-pay | Attending: Emergency Medicine | Admitting: Emergency Medicine

## 2020-06-06 ENCOUNTER — Encounter (HOSPITAL_COMMUNITY): Payer: Self-pay

## 2020-06-06 DIAGNOSIS — Y939 Activity, unspecified: Secondary | ICD-10-CM | POA: Insufficient documentation

## 2020-06-06 DIAGNOSIS — Y999 Unspecified external cause status: Secondary | ICD-10-CM | POA: Insufficient documentation

## 2020-06-06 DIAGNOSIS — Y929 Unspecified place or not applicable: Secondary | ICD-10-CM | POA: Insufficient documentation

## 2020-06-06 DIAGNOSIS — M25562 Pain in left knee: Secondary | ICD-10-CM | POA: Insufficient documentation

## 2020-06-06 DIAGNOSIS — M25512 Pain in left shoulder: Secondary | ICD-10-CM | POA: Insufficient documentation

## 2020-06-06 DIAGNOSIS — Z5321 Procedure and treatment not carried out due to patient leaving prior to being seen by health care provider: Secondary | ICD-10-CM | POA: Insufficient documentation

## 2020-06-06 DIAGNOSIS — M79645 Pain in left finger(s): Secondary | ICD-10-CM | POA: Insufficient documentation
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
## 2020-06-06 NOTE — ED Notes (Signed)
Per registration, patient turned in labels and reports he is leaving. 

## 2021-02-15 ENCOUNTER — Encounter (HOSPITAL_COMMUNITY): Payer: Self-pay | Admitting: Emergency Medicine

## 2021-02-15 ENCOUNTER — Emergency Department (HOSPITAL_COMMUNITY): Payer: Self-pay

## 2021-02-15 ENCOUNTER — Other Ambulatory Visit: Payer: Self-pay

## 2021-02-15 ENCOUNTER — Emergency Department (HOSPITAL_COMMUNITY)
Admission: EM | Admit: 2021-02-15 | Discharge: 2021-02-15 | Disposition: A | Discharge status: Home / Self Care | Payer: Self-pay | Attending: Emergency Medicine | Admitting: Emergency Medicine

## 2021-02-15 DIAGNOSIS — Z87891 Personal history of nicotine dependence: Secondary | ICD-10-CM | POA: Insufficient documentation

## 2021-02-15 DIAGNOSIS — N189 Chronic kidney disease, unspecified: Secondary | ICD-10-CM | POA: Insufficient documentation

## 2021-02-15 DIAGNOSIS — K029 Dental caries, unspecified: Secondary | ICD-10-CM | POA: Insufficient documentation

## 2021-02-15 DIAGNOSIS — K047 Periapical abscess without sinus: Secondary | ICD-10-CM | POA: Insufficient documentation

## 2021-02-15 LAB — CBC WITH DIFFERENTIAL/PLATELET
Abs Immature Granulocytes: 0.04 10*3/uL (ref 0.00–0.07)
Basophils Absolute: 0.1 10*3/uL (ref 0.0–0.1)
Basophils Relative: 1 %
Eosinophils Absolute: 0.4 10*3/uL (ref 0.0–0.5)
Eosinophils Relative: 3 %
HCT: 48.1 % (ref 39.0–52.0)
Hemoglobin: 16.2 g/dL (ref 13.0–17.0)
Immature Granulocytes: 0 %
Lymphocytes Relative: 19 %
Lymphs Abs: 2.1 10*3/uL (ref 0.7–4.0)
MCH: 32.2 pg (ref 26.0–34.0)
MCHC: 33.7 g/dL (ref 30.0–36.0)
MCV: 95.6 fL (ref 80.0–100.0)
Monocytes Absolute: 0.9 10*3/uL (ref 0.1–1.0)
Monocytes Relative: 9 %
Neutro Abs: 7.4 10*3/uL (ref 1.7–7.7)
Neutrophils Relative %: 68 %
Platelets: 370 10*3/uL (ref 150–400)
RBC: 5.03 MIL/uL (ref 4.22–5.81)
RDW: 12.8 % (ref 11.5–15.5)
WBC: 10.9 10*3/uL — ABNORMAL HIGH (ref 4.0–10.5)
nRBC: 0 % (ref 0.0–0.2)

## 2021-02-15 LAB — COMPREHENSIVE METABOLIC PANEL
ALT: 14 U/L (ref 0–44)
AST: 15 U/L (ref 15–41)
Albumin: 4.2 g/dL (ref 3.5–5.0)
Alkaline Phosphatase: 71 U/L (ref 38–126)
Anion gap: 9 (ref 5–15)
BUN: 17 mg/dL (ref 6–20)
CO2: 25 mmol/L (ref 22–32)
Calcium: 8.9 mg/dL (ref 8.9–10.3)
Chloride: 102 mmol/L (ref 98–111)
Creatinine, Ser: 0.86 mg/dL (ref 0.61–1.24)
GFR, Estimated: >60 mL/min (ref >60)
Glucose, Bld: 101 mg/dL — ABNORMAL HIGH (ref 70–99)
Potassium: 3.8 mmol/L (ref 3.5–5.1)
Sodium: 136 mmol/L (ref 135–145)
Total Bilirubin: 0.6 mg/dL (ref 0.3–1.2)
Total Protein: 7.5 g/dL (ref 6.5–8.1)

## 2021-02-15 LAB — LACTIC ACID, PLASMA: Lactic Acid, Venous: 1.1 mmol/L (ref 0.5–1.9)

## 2021-02-15 MED ORDER — CLINDAMYCIN PHOSPHATE 600 MG/50ML IV SOLN
600.0000 mg | Freq: Once | INTRAVENOUS | Status: AC
  Administered 2021-02-15: 600 mg via INTRAVENOUS
  Filled 2021-02-15: qty 50

## 2021-02-15 MED ORDER — IOHEXOL 300 MG/ML  SOLN
75.0000 mL | Freq: Once | INTRAMUSCULAR | Status: AC | PRN
  Administered 2021-02-15: 75 mL via INTRAVENOUS

## 2021-02-15 MED ORDER — AMOXICILLIN-POT CLAVULANATE 875-125 MG PO TABS: 1.0000 | ORAL_TABLET | Freq: Two times a day (BID) | ORAL | 0 refills | Status: DC

## 2021-02-15 NOTE — Discharge Instructions (Signed)
Take Augmentin as prescribed and complete the full course. Apply warm compresses to your face for 20 days at a time.   Follow-up with oral surgery on Monday.
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
## 2021-02-15 NOTE — ED Triage Notes (Signed)
Patient here from home reporting facial pain. Reporting that he was sick for 4 days in bed, got better. Now pain 10/10 mostly to right side.

## 2021-02-15 NOTE — ED Provider Notes (Addendum)
[facility]-EMERGENCY DEPT Provider Note   CSN: [id number] Arrival date & time: 02/15/21  1025     History Chief Complaint  Patient presents with  . Facial Pain    Gabriel Caldwell is a 55 y.o. male.  55 year old male brought in by EMS from home for facial pain and swelling. Patient has a history of CKD, polysubstance abuse, states he had bronchitis and sinus congestion last week lasting 4 days and resolved. Then developed swelling to right upper mouth, thought he had an abscess in the area and stuck a needle in the area last night followed by worsening pain and swelling up the right side of his nose. Reports poor dental hygiene. Patient states he was hallucinating and seeing dead people last night, not currently. No other complaints or concerns. Prior nose surgery.         Past Medical History:  Diagnosis Date  . Chronic kidney disease   . Kidney stones   . Polysubstance abuse (HCC)   . PONV (postoperative nausea and vomiting)     Patient Active Problem List   Diagnosis Date Noted  . Trauma 04/22/2018  . Status epilepticus (HCC)   . Dog bite of hand 11/28/2013    Past Surgical History:  Procedure Laterality Date  . ANKLE SURGERY Left   . HERNIA REPAIR    . I & D EXTREMITY Left 11/28/2013   Procedure: IRRIGATION AND DEBRIDEMENT;  Surgeon: Dominica Severin, MD;  Location: [facility] OR;  Service: Orthopedics;  Laterality: Left;  . NOSE SURGERY         No family history on file.  Social History   Tobacco Use  . Smoking status: Former Smoker    Quit date: 02/20/2011    Years since quitting: 9.9  . Smokeless tobacco: Never Used  Substance Use Topics  . Alcohol use: No  . Drug use: No    Home Medications Prior to Admission medications   Medication Sig Start Date End Date Taking? Authorizing Provider  amoxicillin-clavulanate (AUGMENTIN) 875-125 MG tablet Take 1 tablet by mouth every 12 (twelve) hours. 02/15/21  Yes Jeannie Fend, PA-C  ibuprofen  (ADVIL,MOTRIN) 800 MG tablet Take 1 tablet (800 mg total) by mouth 3 (three) times daily. 01/07/16   Barrett, Rolm Gala, PA-C  methocarbamol (ROBAXIN) 500 MG tablet Take 1 tablet (500 mg total) by mouth 2 (two) times daily. 01/07/16   Barrett, Rolm Gala, PA-C  OVER THE COUNTER MEDICATION CBD Oil for arthritis    [provider]    Allergies    Sulfa antibiotics and Codeine  Review of Systems   Review of Systems  Constitutional: Negative for fever.  HENT: Positive for congestion, dental problem, facial swelling and sinus pressure.   Respiratory: Negative for shortness of breath.   Cardiovascular: Negative for chest pain.  Gastrointestinal: Negative for abdominal pain.  Skin: Negative for wound.  Allergic/Immunologic: Negative for immunocompromised state.  Neurological: Positive for headaches.  Hematological: Negative for adenopathy.  Psychiatric/Behavioral: Positive for hallucinations.  All other systems reviewed and are negative.   Physical Exam Updated Vital Signs BP (!) 150/93   Pulse 96   Temp 98 F (36.7 C) (Oral)   Resp 16   SpO2 97%   Physical Exam Vitals and nursing note reviewed.  Constitutional:      General: He is not in acute distress.    Appearance: He is well-developed. He is not diaphoretic.  HENT:     Head: Atraumatic.  Jaw: No trismus or pain on movement.      Nose: No congestion or rhinorrhea.     Mouth/Throat:     Mouth: Mucous membranes are moist.     Dentition: Dental caries present.     Pharynx: No oropharyngeal exudate or posterior oropharyngeal erythema.     Comments: Very poor dental hygiene, no visible abscess Eyes:     Extraocular Movements: Extraocular movements intact.     Pupils: Pupils are equal, round, and reactive to light.  Pulmonary:     Effort: Pulmonary effort is normal.  Musculoskeletal:     Cervical back: Neck supple.  Lymphadenopathy:     Cervical: No cervical adenopathy.  Skin:    General: Skin is warm and dry.      Findings: Erythema present.  Neurological:     Mental Status: He is alert and oriented to person, place, and time.     Sensory: No sensory deficit.     Motor: No weakness.  Psychiatric:        Behavior: Behavior normal.     ED Results / Procedures / Treatments   Labs (all labs ordered are listed, but only abnormal results are displayed) Labs Reviewed  COMPREHENSIVE METABOLIC PANEL - Abnormal; Notable for the following components:      Result Value   Glucose, Bld 101 (*)    All other components within normal limits  CBC WITH DIFFERENTIAL/PLATELET - Abnormal; Notable for the following components:   WBC 10.9 (*)    All other components within normal limits  LACTIC ACID, PLASMA    EKG None  Radiology CT Maxillofacial W Contrast  Result Date: 02/15/2021 CLINICAL DATA:  Maxillary/facial abscess. Additional history provided: Patient reports right-sided facial pain and swelling. EXAM: CT MAXILLOFACIAL WITH CONTRAST TECHNIQUE: Multidetector CT imaging of the maxillofacial structures was performed with intravenous contrast. Multiplanar CT image reconstructions were also generated. CONTRAST:  64mL OMNIPAQUE IOHEXOL 300 MG/ML  SOLN COMPARISON:  No pertinent prior exams available for comparison. FINDINGS: Motion degraded examination. Osseous: Poor dentition with multiple absent teeth, multiple carious teeth and multifocal periapical lucencies. There is prominent periapical lucency surrounding the right maxillary lateral incisor with superficial cortical breakthrough (series 13, image 48). Immediately adjacent, there is soft tissue swelling and inflammatory stranding as well as a peripherally enhancing focus measuring 1.2 x 1.0 cm in transaxial dimensions, likely reflecting abscess (series 12, image 48). Prominent periapical lucency also surrounds the left medial mandibular incisor and left mandibular canine with superficial cortical breakthrough. No definite adjacent soft tissue abscess is  identified at this site. No acute maxillofacial fracture is identified. Orbits: No acute orbital finding. Sinuses: Trace bilateral ethmoid sinus mucosal thickening. Soft tissues: See discussion above. Limited intracranial: No appreciable acute intracranial abnormality within the field of view. IMPRESSION: Motion degraded examination. Poor dentition with multiple absent teeth, carious teeth and multifocal periapical lucencies. There is prominent periapical lucency surrounding the right maxillary lateral incisor with superficial cortical breakthrough. Immediately adjacent, there is a 1.2 cm soft tissue abscess with surrounding inflammatory stranding/phlegmon. Prominent periapical lucency also surrounds the left mandibular medial incisor and canine with superficial cortical breakthrough. However, there is no definite soft tissue abscess at this site. Electronically Signed   By: Jackey Loge DO   On: 02/15/2021 14:01    Procedures Procedures   Medications Ordered in ED Medications  clindamycin (CLEOCIN) IVPB 600 mg (0 mg Intravenous Stopped 02/15/21 1248)  iohexol (OMNIPAQUE) 300 MG/ML solution 75 mL (75 mLs Intravenous Contrast Given 02/15/21  1308)    ED Course  I have reviewed the triage vital signs and the nursing notes.  Pertinent labs & imaging results that were available during my care of the patient were reviewed by me and considered in my medical decision making (see chart for details).  Clinical Course as of 02/15/21 1430  Sat Feb 15, 2021  5559 55 year old male with complaint of right facial swelling and dental abscess.  On exam found to have swelling along the right side of his nose with overlying erythema and tenderness. No obvious drainable collection visible in his mouth, numerous dental caries with severe poor dental hygiene. CBC with mild leukocytosis with white count of 10.9, lactic acid 1.1, CMP unremarkable.  CT maxillofacial with contrast shows a small dental abscess.  Patient was  given IV clindamycin while in the emergency room. Discussed with Dr. Barbette Merino with oral surgery, recommends Augmentin and will recheck in the office on Monday. Discussed results and plan of care with patient, advised patient not to stick any more needles in his mouth.  Patient verbalizes understanding of discharge instructions and plan. [LM]    Clinical Course User Index [LM] Alden Hipp   MDM Rules/Calculators/A&P                          Final Clinical Impression(s) / ED Diagnoses Final diagnoses:  Dental abscess    Rx / DC Orders ED Discharge Orders         Ordered    amoxicillin-clavulanate (AUGMENTIN) 875-125 MG tablet  Every 12 hours        02/15/21 1425           Jeannie Fend, PA-C 02/15/21 1430    Jeannie Fend, PA-C 02/15/21 1436    Terrilee Files, MD 02/16/21 [phone]

## 2021-07-21 ENCOUNTER — Emergency Department (HOSPITAL_COMMUNITY)
Admission: EM | Admit: 2021-07-21 | Discharge: 2021-07-21 | Disposition: A | Discharge status: Home / Self Care | Payer: Self-pay | Attending: Emergency Medicine | Admitting: Emergency Medicine

## 2021-07-21 ENCOUNTER — Encounter (HOSPITAL_COMMUNITY): Payer: Self-pay | Admitting: Emergency Medicine

## 2021-07-21 ENCOUNTER — Other Ambulatory Visit: Payer: Self-pay

## 2021-07-21 DIAGNOSIS — L237 Allergic contact dermatitis due to plants, except food: Secondary | ICD-10-CM | POA: Insufficient documentation

## 2021-07-21 DIAGNOSIS — L247 Irritant contact dermatitis due to plants, except food: Secondary | ICD-10-CM

## 2021-07-21 DIAGNOSIS — Z87891 Personal history of nicotine dependence: Secondary | ICD-10-CM | POA: Insufficient documentation

## 2021-07-21 DIAGNOSIS — N189 Chronic kidney disease, unspecified: Secondary | ICD-10-CM | POA: Insufficient documentation

## 2021-07-21 MED ORDER — DEXAMETHASONE SODIUM PHOSPHATE 10 MG/ML IJ SOLN
10.0000 mg | Freq: Once | INTRAMUSCULAR | Status: AC
  Administered 2021-07-21: 10 mg
  Filled 2021-07-21: qty 1

## 2021-07-21 MED ORDER — KETOROLAC TROMETHAMINE 60 MG/2ML IM SOLN
30.0000 mg | Freq: Once | INTRAMUSCULAR | Status: AC
  Administered 2021-07-21: 30 mg via INTRAMUSCULAR
  Filled 2021-07-21: qty 2

## 2021-07-21 MED ORDER — CETIRIZINE HCL 10 MG PO TABS: 10.0000 mg | ORAL_TABLET | Freq: Every day | ORAL | 0 refills | Status: DC

## 2021-07-21 MED ORDER — PREDNISONE 20 MG PO TABS: 20.0000 mg | ORAL_TABLET | Freq: Every day | ORAL | 0 refills | Status: AC

## 2021-07-21 NOTE — ED Provider Notes (Signed)
[facility] EMERGENCY DEPARTMENT Provider Note   CSN: [id number] Arrival date & time: 07/21/21  1228     History Chief Complaint  Patient presents with   Allergic Reaction   Rash    Gabriel Caldwell is a 55 y.o. male.  55 year old male with history of CKD, polysubstance abuse.  To the ER secondary to poison ivy exposure.  Exposure occurred early yesterday morning.  Patient with a rash to his bilateral forearms, waist area.  Ankles.  He took Benadryl this morning without sick improvement to her symptoms.  Wounds are itchy, clear drainage.  No fevers or chills, no chest pain, nausea, dyspnea.  No airway involvement.  No mucous membrane involvement.  No difficulty breathing, denies difficulty swallowing or speaking.  No drooling.  He is tolerating his own secretions appropriately.  Does not feel like his neck or throat is swollen.  The history is provided by the patient.  Allergic Reaction Presenting symptoms: itching and rash   Presenting symptoms: no difficulty breathing, no difficulty swallowing, no swelling and no wheezing   Severity:  Mild Duration:  1 day Prior allergic episodes:  Insect allergies and plant allergies Context: poison ivy   Relieved by:  Nothing Worsened by:  Nothing Ineffective treatments:  Antihistamines Rash Associated symptoms: no abdominal pain, no fever, no headaches, no nausea, no shortness of breath, not vomiting and not wheezing       Past Medical History:  Diagnosis Date   Chronic kidney disease    Kidney stones    Polysubstance abuse ([place])    PONV (postoperative nausea and vomiting)     Patient Active Problem List   Diagnosis Date Noted   Trauma 04/22/2018   Status epilepticus ([place])    Dog bite of hand 11/28/2013    Past Surgical History:  Procedure Laterality Date   ANKLE SURGERY Left    HERNIA REPAIR     I & D EXTREMITY Left 11/28/2013   Procedure: IRRIGATION AND DEBRIDEMENT;  Surgeon: Roseanne Kaufman, MD;   Location: [place];  Service: Orthopedics;  Laterality: Left;   NOSE SURGERY         History reviewed. No pertinent family history.  Social History   Tobacco Use   Smoking status: Former    Types: Cigarettes    Quit date: 02/20/2011    Years since quitting: 10.4   Smokeless tobacco: Never  Substance Use Topics   Alcohol use: No   Drug use: No    Home Medications Prior to Admission medications   Medication Sig Start Date End Date Taking? Authorizing Provider  cetirizine (ZYRTEC ALLERGY) 10 MG tablet Take 1 tablet (10 mg total) by mouth daily for 14 days. 07/21/21 08/04/21 Yes Jeanell Sparrow, DO  predniSONE (DELTASONE) 20 MG tablet Take 1 tablet (20 mg total) by mouth daily for 4 days. 07/23/21 07/27/21 Yes Wynona Dove A, DO  amoxicillin-clavulanate (AUGMENTIN) 875-125 MG tablet Take 1 tablet by mouth every 12 (twelve) hours. 02/15/21   Tacy Learn, PA-C  ibuprofen (ADVIL,MOTRIN) 800 MG tablet Take 1 tablet (800 mg total) by mouth 3 (three) times daily. 01/07/16   Barrett, Lahoma Crocker, PA-C  methocarbamol (ROBAXIN) 500 MG tablet Take 1 tablet (500 mg total) by mouth 2 (two) times daily. 01/07/16   Barrett, Lahoma Crocker, PA-C  OVER THE COUNTER MEDICATION CBD Oil for arthritis    [provider]    Allergies    Bee venom, Sulfa antibiotics, Poison oak extract, and Codeine  Review of  Systems   Review of Systems  Constitutional:  Negative for chills and fever.  HENT:  Negative for facial swelling and trouble swallowing.   Eyes:  Negative for photophobia and visual disturbance.  Respiratory:  Negative for cough, shortness of breath, wheezing and stridor.   Cardiovascular:  Negative for chest pain and palpitations.  Gastrointestinal:  Negative for abdominal pain, nausea and vomiting.  Endocrine: Negative for polydipsia and polyuria.  Genitourinary:  Negative for difficulty urinating and hematuria.  Musculoskeletal:  Negative for gait problem and joint swelling.  Skin:  Positive for itching  and rash. Negative for pallor.  Neurological:  Negative for syncope and headaches.  Psychiatric/Behavioral:  Negative for agitation and confusion.    Physical Exam Updated Vital Signs BP (!) 154/86   Pulse 100   Temp 98.7 F (37.1 C) (Oral)   Resp 20   Ht 5\' 9"  (1.753 m)   Wt 74.8 kg   SpO2 98%   BMI 24.37 kg/m   Physical Exam Vitals and nursing note reviewed.  Constitutional:      General: He is not in acute distress.    Appearance: He is well-developed.  HENT:     Head: Normocephalic and atraumatic.     Jaw: There is normal jaw occlusion.     Comments: No angioedema.  Uvula is midline.  There is no swelling to face.  No mucous membrane abnormalities.  He has poor dentition.  Phonation is appropriate.  No stridor.  No drooling.    Right Ear: External ear normal.     Left Ear: External ear normal.     Mouth/Throat:     Mouth: Mucous membranes are moist.     Pharynx: Oropharynx is clear. Uvula midline. No pharyngeal swelling or uvula swelling.  Eyes:     General: No scleral icterus. Cardiovascular:     Rate and Rhythm: Normal rate and regular rhythm.     Pulses: Normal pulses.     Heart sounds: Normal heart sounds.  Pulmonary:     Effort: Pulmonary effort is normal. No respiratory distress.     Breath sounds: Normal breath sounds.  Abdominal:     General: Abdomen is flat.     Palpations: Abdomen is soft.     Tenderness: There is no abdominal tenderness.  Musculoskeletal:        General: Normal range of motion.     Cervical back: Normal range of motion.     Right lower leg: No edema.     Left lower leg: No edema.  Skin:    General: Skin is warm and dry.     Capillary Refill: Capillary refill takes less than 2 seconds.     Comments: Irritant contact dermatitis to bilateral forearms, bilateral ankles, waist.   Neurological:     Mental Status: He is alert and oriented to person, place, and time.  Psychiatric:        Mood and Affect: Mood normal.        Behavior:  Behavior normal.    ED Results / Procedures / Treatments   Labs (all labs ordered are listed, but only abnormal results are displayed) Labs Reviewed - No data to display  EKG None  Radiology No results found.  Procedures Procedures   Medications Ordered in ED Medications  ketorolac (TORADOL) injection 30 mg (has no administration in time range)  dexamethasone (DECADRON) injection 10 mg (has no administration in time range)    ED Course  I have reviewed the triage  vital signs and the nursing notes.  Pertinent labs & imaging results that were available during my care of the patient were reviewed by me and considered in my medical decision making (see chart for details).    MDM Rules/Calculators/A&P                           This patient complains of rash, poison ivy/oak exposure; this involves an extensive number of treatment Options and is a complaint that carries with it a high risk of complications and Morbidity.  Vital signs reviewed and stable.  No mucous membrane involvement.  Airway is clear.  No stridor.  Respiratory status is stable.  No nausea or vomiting.  Serious etiologies considered.   The patient is in no distress and there is no mucosal or oral involvement. Does not appear at this time to be erythema multiforme, bullous, SJS, TEN, shingles. No evidence at this time to suggest RMSF or endocarditis or Lyme disease. Patient looks well, nontoxic and is tolerating oral intake, no neurologic signs or symptoms, no headache or photophobia or neck pain.   The patient improved significantly and was discharged in stable condition. Detailed discussions were had with the patient regarding current findings, and need for close f/u with PCP or on call doctor. The patient has been instructed to return immediately if the symptoms worsen in any way for re-evaluation. Patient verbalized understanding and is in agreement with current care plan. All questions answered prior to  discharge.    Final Clinical Impression(s) / ED Diagnoses Final diagnoses:  Irritant contact dermatitis due to plants, except food    Rx / DC Orders ED Discharge Orders          Ordered    predniSONE (DELTASONE) 20 MG tablet  Daily        07/21/21 1715    cetirizine (ZYRTEC ALLERGY) 10 MG tablet  Daily        07/21/21 1714             Jeanell Sparrow, DO 07/21/21 1721

## 2021-07-21 NOTE — ED Triage Notes (Signed)
Pt here for blistering rash to bilateral arms w/ edema and redness, pt also has facial edema. 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 Pt took benadryl this morning w/ no relief.

## 2021-07-21 NOTE — ED Provider Notes (Signed)
Emergency Medicine Provider Triage Evaluation Note  Gabriel Caldwell , a 55 y.o. male  was evaluated in triage.  Pt complains of rash on his arms and face.  He was exposed to poison oak yesterday.  He states he took 2 pills of Benadryl at 7 this morning without any relief.  He is blisters started to open up about 30 minutes prior to arrival.  He reports the outside of his face is swollen.  He denies any throat, tongue swelling or voice change.  Review of Systems  Positive: Rash Negative: Fever, shortness of breath  Physical Exam  BP 125/85 (BP Location: Right Arm)   Pulse (!) 107   Temp 98.7 F (37.1 C) (Oral)   Resp 20   Ht 5\' 9"  (1.753 m)   Wt 74.8 kg   SpO2 98%   BMI 24.37 kg/m  Gen:   Awake, no distress   Resp:  Normal effort  MSK:   Moves extremities without difficulty  Other:  Weeping rash on left arm.  No elevation of the floor of the mouth or tongue edema. Normal phonation  Medical Decision Making  Medically screening exam initiated at 12:35 PM.  Appropriate orders placed.  ARACELI COUFAL was informed that the remainder of the evaluation will be completed by another provider, this initial triage assessment does not replace that evaluation, and the importance of remaining in the ED until their evaluation is complete.  Note: Portions of this report may have been transcribed using voice recognition software. Every effort was made to ensure accuracy; however, inadvertent computerized transcription errors may be present    Hervey Ard, PA-C 07/21/21 1238    07/23/21, DO 07/21/21 1706

## 2022-01-08 ENCOUNTER — Other Ambulatory Visit: Payer: Self-pay

## 2022-01-08 ENCOUNTER — Emergency Department (HOSPITAL_COMMUNITY): Payer: Self-pay

## 2022-01-08 ENCOUNTER — Emergency Department (HOSPITAL_COMMUNITY)
Admission: EM | Admit: 2022-01-08 | Discharge: 2022-01-08 | Disposition: A | Discharge status: Home / Self Care | Payer: Self-pay | Attending: Emergency Medicine | Admitting: Emergency Medicine

## 2022-01-08 ENCOUNTER — Encounter (HOSPITAL_COMMUNITY): Payer: Self-pay | Admitting: *Deleted

## 2022-01-08 DIAGNOSIS — R1110 Vomiting, unspecified: Secondary | ICD-10-CM | POA: Insufficient documentation

## 2022-01-08 DIAGNOSIS — K5641 Fecal impaction: Secondary | ICD-10-CM | POA: Insufficient documentation

## 2022-01-08 DIAGNOSIS — R748 Abnormal levels of other serum enzymes: Secondary | ICD-10-CM | POA: Insufficient documentation

## 2022-01-08 DIAGNOSIS — R1032 Left lower quadrant pain: Secondary | ICD-10-CM

## 2022-01-08 LAB — CBC WITH DIFFERENTIAL/PLATELET
Abs Immature Granulocytes: 0.04 10*3/uL (ref 0.00–0.07)
Basophils Absolute: 0.1 10*3/uL (ref 0.0–0.1)
Basophils Relative: 1 %
Eosinophils Absolute: 0.5 10*3/uL (ref 0.0–0.5)
Eosinophils Relative: 5 %
HCT: 46.5 % (ref 39.0–52.0)
Hemoglobin: 15.7 g/dL (ref 13.0–17.0)
Immature Granulocytes: 0 %
Lymphocytes Relative: 26 %
Lymphs Abs: 2.6 10*3/uL (ref 0.7–4.0)
MCH: 32.1 pg (ref 26.0–34.0)
MCHC: 33.8 g/dL (ref 30.0–36.0)
MCV: 95.1 fL (ref 80.0–100.0)
Monocytes Absolute: 0.9 10*3/uL (ref 0.1–1.0)
Monocytes Relative: 9 %
Neutro Abs: 5.9 10*3/uL (ref 1.7–7.7)
Neutrophils Relative %: 59 %
Platelets: 337 10*3/uL (ref 150–400)
RBC: 4.89 MIL/uL (ref 4.22–5.81)
RDW: 13 % (ref 11.5–15.5)
WBC: 9.9 10*3/uL (ref 4.0–10.5)
nRBC: 0 % (ref 0.0–0.2)

## 2022-01-08 LAB — COMPREHENSIVE METABOLIC PANEL
ALT: 16 U/L (ref 0–44)
AST: 21 U/L (ref 15–41)
Albumin: 3.7 g/dL (ref 3.5–5.0)
Alkaline Phosphatase: 59 U/L (ref 38–126)
Anion gap: 6 (ref 5–15)
BUN: 20 mg/dL (ref 6–20)
CO2: 26 mmol/L (ref 22–32)
Calcium: 8.7 mg/dL — ABNORMAL LOW (ref 8.9–10.3)
Chloride: 104 mmol/L (ref 98–111)
Creatinine, Ser: 0.85 mg/dL (ref 0.61–1.24)
GFR, Estimated: >60 mL/min (ref >60)
Glucose, Bld: 109 mg/dL — ABNORMAL HIGH (ref 70–99)
Potassium: 3.7 mmol/L (ref 3.5–5.1)
Sodium: 136 mmol/L (ref 135–145)
Total Bilirubin: 0.4 mg/dL (ref 0.3–1.2)
Total Protein: 6.3 g/dL — ABNORMAL LOW (ref 6.5–8.1)

## 2022-01-08 LAB — LIPASE, BLOOD: Lipase: 184 U/L — ABNORMAL HIGH (ref 11–51)

## 2022-01-08 MED ORDER — POLYETHYLENE GLYCOL 3350 17 GM/SCOOP PO POWD: 17.0000 g | Freq: Every day | ORAL | 0 refills | Status: DC

## 2022-01-08 MED ORDER — GLYCERIN (ADULT) 2 G RE SUPP: 1.0000 | RECTAL | 0 refills | Status: DC | PRN

## 2022-01-08 NOTE — ED Notes (Signed)
Pt was disempacted by Josh PA and pt had large amount of stool in bed; pt given washclothes and towels and instructed to clean self; bed linens changed and pt given gown to put on.  ?

## 2022-01-08 NOTE — ED Provider Notes (Signed)
Signout received on this 56 year old male who presented with abdominal pain and negative needing to defecate but unable to.  Patient's CBC was unremarkable.  CMP without acute findings.  Lipase was elevated at 184 however CT scan without acute intra-abdominal process.  CT scan did demonstrate large stool burden.  He was disimpacted by the signout provider.  Patient at the time of signout is awaiting p.o. challenge.  Please see note by Fernand Parkins for full HPI and plan. ? ?Physical Exam  ?BP 116/79   Pulse 91   Temp 98.6 ?F (37 ?C) (Oral)   Resp 16   Ht 5\' 10"  (1.778 m)   Wt 74.8 kg   SpO2 96%   BMI 23.68 kg/m?  ? ? ? ?Procedures  ?Procedures ? ?ED Course / MDM  ?  ?Medical Decision Making ?Amount and/or Complexity of Data Reviewed ?Labs: ordered. ?Radiology: ordered. ? ?Risk ?OTC drugs. ? ? ?Patient able to tolerate p.o. fluids without difficulty.  Patient has ride available and is ready for discharge.  Patient discharged in stable condition ? ? ? ? ?  ? , PA-C ?01/08/22 0732 ? ?  ?03/10/22, MD ?01/08/22 1009 ? ?

## 2022-01-08 NOTE — ED Triage Notes (Signed)
Pt bib ems for c/o abdominal pain, sudden onset lower left abdominal pain; pt has hx of kidney stones; pt describes the pain as coming in waves; pt states he feels "stopped up"; last BM yesterday ? ?BP 151/93 ?P 88 ?RR 20 ?O2 95% ?CBG 125 ?

## 2022-01-08 NOTE — Discharge Instructions (Addendum)
Please read and follow all provided instructions. ? ?Your diagnoses today include:  ?1. Left lower quadrant abdominal pain   ?2. Fecal impaction ([place])   ? ? ?Tests performed today include: ?Blood cell counts and platelets: normal infection fighting cell count ?Kidney and liver function tests ?Pancreas function test (called lipase): was mildly high, please have this rechecked by your doctor if your symptoms are not improving.  ?Urine test to look for infection ?CT scan of your abdomen and pelvis which does not show infection or kidney stones, suspect that your pain is due to fecal impaction and constipation ?Vital signs. See below for your results today.  ? ?Medications prescribed:  ?Glycerin suppository: Rectal medication for constipation ? ?Miralax - laxative ? ?This medication can be found over-the-counter.  ? ?Take any prescribed medications only as directed. ? ?Home care instructions:  ?Follow any educational materials contained in this packet. ? ?Follow-up instructions: ?Please follow-up with your primary care provider in the next 3 days for further evaluation of your symptoms.   ? ?Return instructions:  ?SEEK IMMEDIATE MEDICAL ATTENTION IF: ?The pain does not go away or becomes severe  ?A temperature above 101F develops  ?Repeated vomiting occurs (multiple episodes)  ?The pain becomes localized to portions of the abdomen. The right side could possibly be appendicitis. In an adult, the left lower portion of the abdomen could be colitis or diverticulitis.  ?Blood is being passed in stools or vomit (bright red or black tarry stools)  ?You develop chest pain, difficulty breathing, dizziness or fainting, or become confused, poorly responsive, or inconsolable (young children) ?If you have any other emergent concerns regarding your health ? ?Additional Information: ?Abdominal (belly) pain can be caused by many things. Your caregiver performed an examination and possibly ordered blood/urine tests and imaging (CT scan,  x-rays, ultrasound). Many cases can be observed and treated at home after initial evaluation in the emergency department. Even though you are being discharged home, abdominal pain can be unpredictable. Therefore, you need a repeated exam if your pain does not resolve, returns, or worsens. Most patients with abdominal pain don't have to be admitted to the hospital or have surgery, but serious problems like appendicitis and gallbladder attacks can start out as nonspecific pain. Many abdominal conditions cannot be diagnosed in one visit, so follow-up evaluations are very important. ? ?Your vital signs today were: ?BP (!) 136/93 (BP Location: Right Arm)   Pulse 73   Temp 98 ?F (36.7 ?C) (Oral)   Resp 16   Ht 5\' 10"  (1.778 m)   Wt 74.8 kg   SpO2 90%   BMI 23.68 kg/m?  ?If your blood pressure (bp) was elevated above 135/85 this visit, please have this repeated by your doctor within one month. ?-------------- ? ?

## 2022-01-08 NOTE — ED Provider Notes (Addendum)
[facility]-EMERGENCY DEPT Provider Note   CSN: [id number] Arrival date & time: 01/08/22  0515     History  Chief Complaint  Patient presents with   Abdominal Pain    Gabriel Caldwell is a 56 y.o. male.  Patient with history of kidney stones presents to the emergency department today for evaluation of left lower quadrant abdominal pain.  Symptoms started early this morning and awoke him from sleep.  States that he did not have any symptoms yesterday.  States that he feels constipated but also states that he was incontinent of stool.  He reports vomiting.  No chest pain or shortness of breath.  No fevers.  No treatment prior to arrival.  He lives in an RV and states that the pain is so bad that he cannot drive to the emergency department himself, so he called the ambulance.      Home Medications Prior to Admission medications   Medication Sig Start Date End Date Taking? Authorizing Provider  amoxicillin-clavulanate (AUGMENTIN) 875-125 MG tablet Take 1 tablet by mouth every 12 (twelve) hours. 02/15/21   Jeannie Fend, PA-C  cetirizine (ZYRTEC ALLERGY) 10 MG tablet Take 1 tablet (10 mg total) by mouth daily for 14 days. 07/21/21 08/04/21  Sloan Leiter, DO  ibuprofen (ADVIL,MOTRIN) 800 MG tablet Take 1 tablet (800 mg total) by mouth 3 (three) times daily. 01/07/16   Barrett, Rolm Gala, PA-C  methocarbamol (ROBAXIN) 500 MG tablet Take 1 tablet (500 mg total) by mouth 2 (two) times daily. 01/07/16   Barrett, Rolm Gala, PA-C  OVER THE COUNTER MEDICATION CBD Oil for arthritis    [provider]      Allergies    Bee venom, Sulfa antibiotics, Poison oak extract, and Codeine    Review of Systems   Review of Systems  Physical Exam Updated Vital Signs BP (!) 136/93 (BP Location: Right Arm)    Pulse 73    Temp 98 F (36.7 C) (Oral)    Resp 16    Ht 5\' 10"  (1.778 m)    Wt 74.8 kg    SpO2 90%    BMI 23.68 kg/m  Physical Exam Vitals and nursing note reviewed.   Constitutional:      General: He is not in acute distress.    Appearance: He is well-developed.     Comments: Upon entering the room, patient lying in the bed with his eyes closed.  After I sit down to start talking to him, he opens his eyes and starts shaking and pain.  HENT:     Head: Normocephalic and atraumatic.  Eyes:     General:        Right eye: No discharge.        Left eye: No discharge.     Conjunctiva/sclera: Conjunctivae normal.  Cardiovascular:     Rate and Rhythm: Normal rate and regular rhythm.     Heart sounds: Normal heart sounds.  Pulmonary:     Effort: Pulmonary effort is normal.     Breath sounds: Normal breath sounds.  Abdominal:     Palpations: Abdomen is soft.     Tenderness: There is abdominal tenderness in the left lower quadrant. There is no guarding or rebound.  Musculoskeletal:     Cervical back: Normal range of motion and neck supple.  Skin:    General: Skin is warm and dry.  Neurological:     Mental Status: He is alert.    ED Results / Procedures /  Treatments   Labs (all labs ordered are listed, but only abnormal results are displayed) Labs Reviewed  COMPREHENSIVE METABOLIC PANEL - Abnormal; Notable for the following components:      Result Value   Glucose, Bld 109 (*)    Calcium 8.7 (*)    Total Protein 6.3 (*)    All other components within normal limits  LIPASE, BLOOD - Abnormal; Notable for the following components:   Lipase 184 (*)    All other components within normal limits  CBC WITH DIFFERENTIAL/PLATELET  URINALYSIS, ROUTINE W REFLEX MICROSCOPIC    EKG None  Radiology CT Renal Stone Study  Result Date: 01/08/2022 CLINICAL DATA:  56 year old male with history of flank pain and left lower quadrant abdominal pain. History of kidney stones. EXAM: CT ABDOMEN AND PELVIS WITHOUT CONTRAST TECHNIQUE: Multidetector CT imaging of the abdomen and pelvis was performed following the standard protocol without IV contrast. RADIATION DOSE  REDUCTION: This exam was performed according to the departmental dose-optimization program which includes automated exposure control, adjustment of the mA and/or kV according to patient size and/or use of iterative reconstruction technique. COMPARISON:  CT of the abdomen and pelvis 02/12/2020. FINDINGS: Lower chest: Unremarkable. Hepatobiliary: No suspicious cystic or solid hepatic lesions are confidently identified on today's noncontrast CT examination. Unenhanced appearance of the gallbladder is normal. Pancreas: No definite pancreatic mass or peripancreatic fluid collections or inflammatory changes are noted on today's noncontrast CT examination. Spleen: Unremarkable. Adrenals/Urinary Tract: 5 mm nonobstructive calculus in the lower pole collecting system of the left kidney. No additional calculi identified within the right renal collecting system, along the course of either ureter, or within the lumen of the urinary bladder. There is no hydroureteronephrosis. Unenhanced appearance of the kidneys, bilateral adrenal glands and urinary bladder is otherwise normal. Stomach/Bowel: Unenhanced appearance of the stomach is normal. There is no pathologic dilatation of small bowel or colon. A few scattered colonic diverticulae are noted, without surrounding inflammatory changes to suggest an acute diverticulitis. Normal appendix. Vascular/Lymphatic: Aortic atherosclerosis. No lymphadenopathy noted in the abdomen or pelvis. Reproductive: Prostate gland and seminal vesicles are unremarkable in appearance. Other: No significant volume of ascites.  No pneumoperitoneum. Musculoskeletal: There are no aggressive appearing lytic or blastic lesions noted in the visualized portions of the skeleton. IMPRESSION: 1. 5 mm nonobstructive calculus in the lower pole collecting system of the left kidney. No ureteral stones or findings of urinary tract obstruction are noted at this time. 2. Mild colonic diverticulosis without evidence of  acute diverticulitis at this time. 3. Aortic atherosclerosis. Electronically Signed   By: Trudie Reed M.D.   On: 01/08/2022 06:05    Procedures Procedures    Medications Ordered in ED Medications - No data to display  ED Course/ Medical Decision Making/ A&P    Patient seen and examined. History obtained directly from patient.   Labs/EKG: Ordered CBC, CMP, lipase, UA.  Imaging: Ordered noncontrast CT of the abdomen pelvis.  Medications/Fluids: None  Most recent vital signs reviewed and are as follows: BP (!) 136/93 (BP Location: Right Arm)    Pulse 73    Temp 98 F (36.7 C) (Oral)    Resp 16    Ht 5\' 10"  (1.778 m)    Wt 74.8 kg    SpO2 90%    BMI 23.68 kg/m   Initial impression: Acute abdominal pain  6:19 AM Reassessment performed. Patient appears uncomfortable.  Labs personally reviewed and interpreted including: CBC with normal white blood cell count and hemoglobin.  Imaging personally visualized and interpreted including: CT scan of the abdomen pelvis, no ureteral stones, large stool burden in rectum and descending colon.  Reviewed pertinent lab work and imaging with patient at bedside. Questions answered.   Offered attempt at disimpaction.  Patient agrees.  Fecal disimpaction performed with some results.  Stool is hard but pliable. Patient is still trying to have a bowel movement.  Suspect that most of his pain is related to fecal impaction and constipation.  Most current vital signs reviewed and are as follows: BP (!) 136/93 (BP Location: Right Arm)    Pulse 73    Temp 98 F (36.7 C) (Oral)    Resp 16    Ht 5\' 10"  (1.778 m)    Wt 74.8 kg    SpO2 90%    BMI 23.68 kg/m   Plan: Await remainder of labs, otherwise home with MiraLAX and glycerin suppositories.  6:36 AM patient's lipase is mildly elevated at 184.  Patient's pain is lower and not upper.  He did have 1 episode of vomiting however overall symptoms are not consistent clinically with acute pancreatitis.  CT  also not consistent with this currently. Patient encouraged to have this rechecked if symptoms or not improving.  Karie MainlandAli PA-C aware patient at shift change.  Plan: P.o. challenge, home with medications as discussed above.  The patient was urged to return to the Emergency Department immediately with worsening of current symptoms, worsening abdominal pain, persistent vomiting, blood noted in stools, fever, or any other concerns. The patient verbalized understanding.                             Medical Decision Making Amount and/or Complexity of Data Reviewed Labs: ordered. Radiology: ordered.  Risk OTC drugs.   For this patient's complaint of abdominal pain, the following conditions were considered on the differential diagnosis: gastritis/PUD, enteritis/duodenitis, appendicitis, cholelithiasis/cholecystitis, cholangitis, pancreatitis, ruptured viscus, colitis, diverticulitis, proctitis, cystitis, pyelonephritis, ureteral colic, aortic dissection, aortic aneurysm. In women, ectopic pregnancy, pelvic inflammatory disease, ovarian cysts, and tubo-ovarian abscess were also considered. Atypical chest etiologies were also considered including ACS, PE, and pneumonia.   The patient's vital signs, pertinent lab work and imaging were reviewed and interpreted as discussed in the ED course. Hospitalization was considered for further testing, treatments, or serial exams/observation. However as patient is well-appearing, has a stable exam, and reassuring studies today, I do not feel that they warrant admission at this time. This plan was discussed with the patient who verbalizes agreement and comfort with this plan and seems reliable and able to return to the Emergency Department with worsening or changing symptoms.          Final Clinical Impression(s) / ED Diagnoses Final diagnoses:  Left lower quadrant abdominal pain  Fecal impaction (HCC)    Rx / DC Orders ED Discharge Orders           Ordered    polyethylene glycol powder (GLYCOLAX/MIRALAX) 17 GM/SCOOP powder  Daily        01/08/22 0622    glycerin adult 2 g suppository  As needed        01/08/22 0622              Renne CriglerGeiple, Keath Matera, PA-C 01/08/22 [id number]    Nira Connardama, Pedro Eduardo, MD 01/08/22 [phone]

## 2022-01-08 NOTE — ED Notes (Signed)
Reviewed discharge instructions with patient. Patient denies any needs or questions. Gave patient blue scrub pants since his was soiled. Patient states he is able to find a ride home at this time  ?

## 2022-01-08 NOTE — ED Notes (Signed)
Pt given ginger ale and encouraged to drink.  

## 2022-02-27 ENCOUNTER — Other Ambulatory Visit: Payer: Self-pay

## 2022-02-27 ENCOUNTER — Emergency Department (HOSPITAL_COMMUNITY): Payer: Self-pay

## 2022-02-27 ENCOUNTER — Encounter (HOSPITAL_COMMUNITY): Payer: Self-pay

## 2022-02-27 ENCOUNTER — Inpatient Hospital Stay (HOSPITAL_COMMUNITY)
Admission: EM | Admit: 2022-02-27 | Discharge: 2022-03-01 | DRG: 871 | Disposition: A | Discharge status: Home / Self Care | Payer: Self-pay | Attending: Internal Medicine | Admitting: Internal Medicine

## 2022-02-27 DIAGNOSIS — Z87442 Personal history of urinary calculi: Secondary | ICD-10-CM

## 2022-02-27 DIAGNOSIS — L0390 Cellulitis, unspecified: Secondary | ICD-10-CM | POA: Diagnosis present

## 2022-02-27 DIAGNOSIS — N170 Acute kidney failure with tubular necrosis: Secondary | ICD-10-CM | POA: Diagnosis present

## 2022-02-27 DIAGNOSIS — L03313 Cellulitis of chest wall: Secondary | ICD-10-CM

## 2022-02-27 DIAGNOSIS — Z20822 Contact with and (suspected) exposure to covid-19: Secondary | ICD-10-CM | POA: Diagnosis present

## 2022-02-27 DIAGNOSIS — Z882 Allergy status to sulfonamides status: Secondary | ICD-10-CM

## 2022-02-27 DIAGNOSIS — E876 Hypokalemia: Secondary | ICD-10-CM | POA: Diagnosis present

## 2022-02-27 DIAGNOSIS — R112 Nausea with vomiting, unspecified: Secondary | ICD-10-CM | POA: Diagnosis present

## 2022-02-27 DIAGNOSIS — N179 Acute kidney failure, unspecified: Secondary | ICD-10-CM | POA: Diagnosis present

## 2022-02-27 DIAGNOSIS — R197 Diarrhea, unspecified: Secondary | ICD-10-CM | POA: Diagnosis present

## 2022-02-27 DIAGNOSIS — E871 Hypo-osmolality and hyponatremia: Secondary | ICD-10-CM | POA: Diagnosis present

## 2022-02-27 DIAGNOSIS — Z91030 Bee allergy status: Secondary | ICD-10-CM

## 2022-02-27 DIAGNOSIS — F17210 Nicotine dependence, cigarettes, uncomplicated: Secondary | ICD-10-CM | POA: Diagnosis present

## 2022-02-27 DIAGNOSIS — E860 Dehydration: Secondary | ICD-10-CM | POA: Diagnosis present

## 2022-02-27 DIAGNOSIS — A419 Sepsis, unspecified organism: Principal | ICD-10-CM | POA: Diagnosis present

## 2022-02-27 DIAGNOSIS — Z885 Allergy status to narcotic agent status: Secondary | ICD-10-CM

## 2022-02-27 LAB — URINALYSIS, ROUTINE W REFLEX MICROSCOPIC
Bilirubin Urine: NEGATIVE
Cellular Cast, UA: 12
Glucose, UA: NEGATIVE mg/dL
Ketones, ur: NEGATIVE mg/dL
Leukocytes,Ua: NEGATIVE
Nitrite: NEGATIVE
Protein, ur: 100 mg/dL — AB
Specific Gravity, Urine: 1.017 (ref 1.005–1.030)
pH: 5 (ref 5.0–8.0)

## 2022-02-27 LAB — PROTIME-INR
INR: 1 (ref 0.8–1.2)
Prothrombin Time: 13.5 seconds (ref 11.4–15.2)

## 2022-02-27 LAB — CBC WITH DIFFERENTIAL/PLATELET
Abs Immature Granulocytes: 0.09 10*3/uL — ABNORMAL HIGH (ref 0.00–0.07)
Basophils Absolute: 0 10*3/uL (ref 0.0–0.1)
Basophils Relative: 0 %
Eosinophils Absolute: 0 10*3/uL (ref 0.0–0.5)
Eosinophils Relative: 0 %
HCT: 45.6 % (ref 39.0–52.0)
Hemoglobin: 16.4 g/dL (ref 13.0–17.0)
Immature Granulocytes: 0 %
Lymphocytes Relative: 4 %
Lymphs Abs: 0.8 10*3/uL (ref 0.7–4.0)
MCH: 33.1 pg (ref 26.0–34.0)
MCHC: 36 g/dL (ref 30.0–36.0)
MCV: 92.1 fL (ref 80.0–100.0)
Monocytes Absolute: 1.1 10*3/uL — ABNORMAL HIGH (ref 0.1–1.0)
Monocytes Relative: 6 %
Neutro Abs: 18.1 10*3/uL — ABNORMAL HIGH (ref 1.7–7.7)
Neutrophils Relative %: 90 %
Platelets: 308 10*3/uL (ref 150–400)
RBC: 4.95 MIL/uL (ref 4.22–5.81)
RDW: 13.3 % (ref 11.5–15.5)
WBC: 20.1 10*3/uL — ABNORMAL HIGH (ref 4.0–10.5)
nRBC: 0 % (ref 0.0–0.2)

## 2022-02-27 LAB — RESP PANEL BY RT-PCR (FLU A&B, COVID) ARPGX2
Influenza A by PCR: NEGATIVE
Influenza B by PCR: NEGATIVE
SARS Coronavirus 2 by RT PCR: NEGATIVE

## 2022-02-27 LAB — COMPREHENSIVE METABOLIC PANEL
ALT: 19 U/L (ref 0–44)
AST: 19 U/L (ref 15–41)
Albumin: 3.6 g/dL (ref 3.5–5.0)
Alkaline Phosphatase: 63 U/L (ref 38–126)
Anion gap: 13 (ref 5–15)
BUN: 28 mg/dL — ABNORMAL HIGH (ref 6–20)
CO2: 22 mmol/L (ref 22–32)
Calcium: 8.8 mg/dL — ABNORMAL LOW (ref 8.9–10.3)
Chloride: 96 mmol/L — ABNORMAL LOW (ref 98–111)
Creatinine, Ser: 2.04 mg/dL — ABNORMAL HIGH (ref 0.61–1.24)
GFR, Estimated: 38 mL/min — ABNORMAL LOW (ref >60)
Glucose, Bld: 127 mg/dL — ABNORMAL HIGH (ref 70–99)
Potassium: 3.5 mmol/L (ref 3.5–5.1)
Sodium: 131 mmol/L — ABNORMAL LOW (ref 135–145)
Total Bilirubin: 0.2 mg/dL — ABNORMAL LOW (ref 0.3–1.2)
Total Protein: 7.1 g/dL (ref 6.5–8.1)

## 2022-02-27 LAB — APTT: aPTT: 34 seconds (ref 24–36)

## 2022-02-27 LAB — LIPASE, BLOOD: Lipase: 29 U/L (ref 11–51)

## 2022-02-27 LAB — LACTIC ACID, PLASMA: Lactic Acid, Venous: 1.4 mmol/L (ref 0.5–1.9)

## 2022-02-27 MED ORDER — SODIUM CHLORIDE 0.9 % IV SOLN
2.0000 g | Freq: Once | INTRAVENOUS | Status: AC
  Administered 2022-02-27: 2 g via INTRAVENOUS
  Filled 2022-02-27: qty 12.5

## 2022-02-27 MED ORDER — IOHEXOL 300 MG/ML  SOLN
85.0000 mL | Freq: Once | INTRAMUSCULAR | Status: AC | PRN
  Administered 2022-02-27: 85 mL via INTRAVENOUS

## 2022-02-27 MED ORDER — ENOXAPARIN SODIUM 40 MG/0.4ML IJ SOSY
40.0000 mg | PREFILLED_SYRINGE | INTRAMUSCULAR | Status: DC
  Administered 2022-02-27 – 2022-02-28 (×2): 40 mg via SUBCUTANEOUS
  Filled 2022-02-27 (×2): qty 0.4

## 2022-02-27 MED ORDER — VANCOMYCIN VARIABLE DOSE PER UNSTABLE RENAL FUNCTION (PHARMACIST DOSING): Status: DC

## 2022-02-27 MED ORDER — VANCOMYCIN HCL IN DEXTROSE 1-5 GM/200ML-% IV SOLN: 1000.0000 mg | Freq: Once | INTRAVENOUS | Status: DC

## 2022-02-27 MED ORDER — ACETAMINOPHEN 325 MG PO TABS: 650.0000 mg | ORAL_TABLET | Freq: Four times a day (QID) | ORAL | Status: DC | PRN

## 2022-02-27 MED ORDER — LACTATED RINGERS IV BOLUS
1000.0000 mL | Freq: Once | INTRAVENOUS | Status: AC
  Administered 2022-02-27: 1000 mL via INTRAVENOUS

## 2022-02-27 MED ORDER — VANCOMYCIN HCL 1500 MG/300ML IV SOLN
1500.0000 mg | Freq: Once | INTRAVENOUS | Status: AC
  Administered 2022-02-27: 1500 mg via INTRAVENOUS
  Filled 2022-02-27: qty 300

## 2022-02-27 MED ORDER — PIPERACILLIN-TAZOBACTAM 3.375 G IVPB 30 MIN
3.3750 g | Freq: Once | INTRAVENOUS | Status: AC
  Administered 2022-02-27: 3.375 g via INTRAVENOUS
  Filled 2022-02-27: qty 50

## 2022-02-27 MED ORDER — LACTATED RINGERS IV SOLN: INTRAVENOUS | Status: AC

## 2022-02-27 MED ORDER — SODIUM CHLORIDE 0.9 % IV SOLN
2.0000 g | Freq: Two times a day (BID) | INTRAVENOUS | Status: DC
  Administered 2022-02-28 (×2): 2 g via INTRAVENOUS
  Filled 2022-02-27 (×3): qty 12.5

## 2022-02-27 MED ORDER — ACETAMINOPHEN 650 MG RE SUPP: 650.0000 mg | Freq: Four times a day (QID) | RECTAL | Status: DC | PRN

## 2022-02-27 MED ORDER — LACTATED RINGERS IV BOLUS
500.0000 mL | Freq: Once | INTRAVENOUS | Status: AC
  Administered 2022-02-27: 500 mL via INTRAVENOUS

## 2022-02-27 MED ORDER — ACETAMINOPHEN 325 MG PO TABS
650.0000 mg | ORAL_TABLET | Freq: Four times a day (QID) | ORAL | Status: DC | PRN
  Administered 2022-02-27 – 2022-03-01 (×4): 650 mg via ORAL
  Filled 2022-02-27 (×4): qty 2

## 2022-02-27 MED ORDER — ACETAMINOPHEN 500 MG PO TABS
1000.0000 mg | ORAL_TABLET | Freq: Four times a day (QID) | ORAL | Status: DC | PRN
  Administered 2022-02-27: 1000 mg via ORAL
  Filled 2022-02-27: qty 2

## 2022-02-27 MED ORDER — PIPERACILLIN-TAZOBACTAM 3.375 G IVPB
3.3750 g | Freq: Three times a day (TID) | INTRAVENOUS | Status: DC
  Filled 2022-02-27: qty 50

## 2022-02-27 NOTE — ED Triage Notes (Signed)
Pt arrived POV from home c/o N/V and fever x3 days. Pt also states he has pain under his right underarm. Pt is tender to palpation but area is not red or swollen.  ?

## 2022-02-27 NOTE — Progress Notes (Addendum)
Pharmacy Antibiotic Note ? ?Gabriel Caldwell is a 56 y.o. male admitted on 02/27/2022 with  intra-abdominal infection .  Pharmacy has been consulted for Zosyn dosing. ? ?WBC elevated. SCr 2.04 (BL ~ 0.85)  ? ?Plan: ?-Zosyn 3.375 gm IV Q 8 hours (EI infusion) ?-Monitor CBC, renal fx, cultures and clinical progress ? ?Height: 5\' 10"  (177.8 cm) ?Weight: 74.4 kg (164 lb) ?IBW/kg (Calculated) : 73 ? ?Temp (24hrs), Avg:101.1 ?F (38.4 ?C), Min:100.3 ?F (37.9 ?C), Max:101.8 ?F (38.8 ?C) ? ?Recent Labs  ?Lab 02/27/22 ?1258  ?WBC 20.1*  ?CREATININE 2.04*  ?LATICACIDVEN 1.4  ?  ?Estimated Creatinine Clearance: 43.2 mL/min (A) (by C-G formula based on SCr of 2.04 mg/dL (H)).   ? ?Allergies  ?Allergen Reactions  ? Bee Venom Anaphylaxis  ? Sulfa Antibiotics Hives  ? Poison Oak Extract Swelling and Rash  ? Codeine Nausea And Vomiting and Other (See Comments)  ?  Headache   ? ? ?Antimicrobials this admission: ?Zosyn 4/28 >>  ? ? ?Dose adjustments this admission: ? ? ?Microbiology results: ?4/28 BCx:  ?4/28 UCx:   ? ? ?Thank you for allowing pharmacy to be a part of this patient?s care. ? ?5/28, PharmD., BCCCP ?Clinical Pharmacist ?Please refer to [facility] for unit-specific pharmacist  ? ?Addendum: Now adding vancomycin and switching zosyn to cefepime. Will start vancomycin 1500 mg IV load followed by vancomycin dosing per levels. Order Cefepime 2 gm IV q 12 hours.  ? ?[facility], PharmD., BCCCP ?Clinical Pharmacist ?Please refer to [facility] for unit-specific pharmacist  ? ? ? ?

## 2022-02-27 NOTE — ED Provider Triage Note (Signed)
Emergency Medicine Provider Triage Evaluation Note ? ?Gabriel Caldwell , a 56 y.o. male  was evaluated in triage.  Pt complains of fever 3 days up to 104.  He has been taking Tylenol and ibuprofen, but the fever keeps returning.  He says he has had generalized body aches, and fatigue.  He is also had associated nausea and vomiting.  He has been unable to keep food down.  Denies any abdominal pain.  He does not have any upper respiratory symptoms.  He states he has pain in his right axillary region as well as his bilateral neck.  No wounds that he notes. No shortness of breath, chest pain, or diarrhea.  ? ?Review of Systems  ?Positive: Fever, nausea, vomiting ?Negative:  ? ?Physical Exam  ?There were no vitals taken for this visit. ?Gen:   Awake, no distress   ?Resp:  Normal effort  ?MSK:   Moves extremities without difficulty  ?Other:   ? ?Medical Decision Making  ?Medically screening exam initiated at 12:49 PM.  Appropriate orders placed.  DUSTINE STICKLER was informed that the remainder of the evaluation will be completed by another provider, this initial triage assessment does not replace that evaluation, and the importance of remaining in the ED until their evaluation is complete. ? ?Patient is very tachycardic.  His fever is 100.3.  This is after taking Motrin at home.  It is unclear if there is a source for possible infection.  Does not sound like he is having upper respiratory symptoms, there are no wounds that I can find, no skin changes on initial exam.  Dentition is abnormal, but no signs of Ludwig's on exam.  Broad work-up is initiated. ?  ?Claudie Leach, PA-C ?02/27/22 1252 ? ?

## 2022-02-27 NOTE — H&P (Signed)
?History and Physical  ? ? ?Gabriel Caldwell QMG:[id number] DOB: 12/27/1965 DOA: 02/27/2022 ? ?PCP: Pcp, No  ?Patient coming from: Patient lives in an RV. ? ?Chief Complaint: Fever chills. ? ?HPI: Gabriel Caldwell is a 56 y.o. male with history of polysubstance abuse admits to taking cocaine yesterday presents to the ER with complaints of nausea vomiting and diarrhea with fever chills ongoing for the last 3 days.  Denies injecting any drugs.  Denies any blood in the vomitus or diarrhea.  Has been having some right-sided chest pain for the last few days.  Patient states his close friend was recently diagnosed with tickborne illness.  Patient has such has not noticed any ticks on him. ? ?ED Course: In the ER patient was febrile tachycardic with labs showing leukocytosis.  CT chest abdomen pelvis done shows features concerning for right chest wall cellulitis.  Patient had blood cultures drawn and started on empiric antibiotics. ? ?Review of Systems: As per HPI, rest all negative. ? ? ?Past Medical History:  ?Diagnosis Date  ? Chronic kidney disease   ? Kidney stones   ? Polysubstance abuse (HCC)   ? PONV (postoperative nausea and vomiting)   ? ? ?Past Surgical History:  ?Procedure Laterality Date  ? ANKLE SURGERY Left   ? HERNIA REPAIR    ? I & D EXTREMITY Left 11/28/2013  ? Procedure: IRRIGATION AND DEBRIDEMENT;  Surgeon: Dominica Severin, MD;  Location: [facility] OR;  Service: Orthopedics;  Laterality: Left;  ? NOSE SURGERY    ? ? ? reports that he has been smoking cigarettes. He has never used smokeless tobacco. He reports current drug use. Drug: Cocaine. He reports that he does not drink alcohol. ? ?Allergies  ?Allergen Reactions  ? Bee Venom Anaphylaxis  ? Sulfa Antibiotics Hives  ? Poison Oak Extract Swelling and Rash  ? Codeine Nausea And Vomiting and Other (See Comments)  ?  Headache   ? ? ?History reviewed. No pertinent family history. ? ?Prior to Admission medications   ?Medication Sig Start Date End Date Taking?  Authorizing Provider  ?glycerin adult 2 g suppository Place 1 suppository rectally as needed for constipation. 01/08/22   Renne Crigler, PA-C  ?ibuprofen (ADVIL,MOTRIN) 800 MG tablet Take 1 tablet (800 mg total) by mouth 3 (three) times daily. 01/07/16   Barrett, Rolm Gala, PA-C  ?methocarbamol (ROBAXIN) 500 MG tablet Take 1 tablet (500 mg total) by mouth 2 (two) times daily. 01/07/16   Barrett, Rolm Gala, PA-C  ?OVER THE COUNTER MEDICATION CBD Oil for arthritis    [provider]  ?polyethylene glycol powder (GLYCOLAX/MIRALAX) 17 GM/SCOOP powder Take 17 g by mouth daily. 01/08/22   Renne Crigler, PA-C  ? ? ?Physical Exam: ?Constitutional: Moderately built and nourished. ?Vitals:  ? 02/27/22 1920 02/27/22 1940 02/27/22 1941 02/27/22 2114  ?BP: 121/78 115/74    ?Pulse: 83 84 85   ?Resp: 15 20 17    ?Temp:    99.3 ?F (37.4 ?C)  ?TempSrc:    Oral  ?SpO2: 98% 97% 97%   ?Weight:      ?Height:      ? ?Eyes: Anicteric no pallor. ?ENMT: No discharge from the ears eyes nose and mouth. ?Neck: No mass felt.  No neck rigidity. ?Respiratory: No rhonchi or crepitations. ?Cardiovascular: S1-S2 heard. ?Abdomen: Soft nontender bowel sound present. ?Musculoskeletal: Right chest wall tenderness. ?Skin: Erythema of the right chest wall.  Circular area. ?Neurologic: Alert awake oriented time place and person.  Moves all extremities.   ?Psychiatric:  Appears normal.  Normal affect. ? ? ?Labs on Admission: I have personally reviewed following labs and imaging studies ? ?CBC: ?Recent Labs  ?Lab 02/27/22 ?1258  ?WBC 20.1*  ?NEUTROABS 18.1*  ?HGB 16.4  ?HCT 45.6  ?MCV 92.1  ?PLT 308  ? ?Basic Metabolic Panel: ?Recent Labs  ?Lab 02/27/22 ?1258  ?NA 131*  ?K 3.5  ?CL 96*  ?CO2 22  ?GLUCOSE 127*  ?BUN 28*  ?CREATININE 2.04*  ?CALCIUM 8.8*  ? ?GFR: ?Estimated Creatinine Clearance: 43.2 mL/min (A) (by C-G formula based on SCr of 2.04 mg/dL (H)). ?Liver Function Tests: ?Recent Labs  ?Lab 02/27/22 ?1258  ?AST 19  ?ALT 19  ?ALKPHOS 63  ?BILITOT 0.2*  ?PROT  7.1  ?ALBUMIN 3.6  ? ?Recent Labs  ?Lab 02/27/22 ?1258  ?LIPASE 29  ? ?No results for input(s): AMMONIA in the last 168 hours. ?Coagulation Profile: ?Recent Labs  ?Lab 02/27/22 ?1258  ?INR 1.0  ? ?Cardiac Enzymes: ?No results for input(s): CKTOTAL, CKMB, CKMBINDEX, TROPONINI in the last 168 hours. ?BNP (last 3 results) ?No results for input(s): PROBNP in the last 8760 hours. ?HbA1C: ?No results for input(s): HGBA1C in the last 72 hours. ?CBG: ?No results for input(s): GLUCAP in the last 168 hours. ?Lipid Profile: ?No results for input(s): CHOL, HDL, LDLCALC, TRIG, CHOLHDL, LDLDIRECT in the last 72 hours. ?Thyroid Function Tests: ?No results for input(s): TSH, T4TOTAL, FREET4, T3FREE, THYROIDAB in the last 72 hours. ?Anemia Panel: ?No results for input(s): VITAMINB12, FOLATE, FERRITIN, TIBC, IRON, RETICCTPCT in the last 72 hours. ?Urine analysis: ?   ?Component Value Date/Time  ? COLORURINE YELLOW 02/27/2022 1300  ? APPEARANCEUR CLOUDY (A) 02/27/2022 1300  ? LABSPEC 1.017 02/27/2022 1300  ? PHURINE 5.0 02/27/2022 1300  ? GLUCOSEU NEGATIVE 02/27/2022 1300  ? HGBUR MODERATE (A) 02/27/2022 1300  ? BILIRUBINUR NEGATIVE 02/27/2022 1300  ? KETONESUR NEGATIVE 02/27/2022 1300  ? PROTEINUR 100 (A) 02/27/2022 1300  ? UROBILINOGEN 0.2 09/03/2009 1746  ? NITRITE NEGATIVE 02/27/2022 1300  ? LEUKOCYTESUR NEGATIVE 02/27/2022 1300  ? ?Sepsis Labs: ?@LABRCNTIP (procalcitonin:4,lacticidven:4) ?) ?Recent Results (from the past 240 hour(s))  ?Resp Panel by RT-PCR (Flu A&B, Covid) Nasopharyngeal Swab     Status: None  ? Collection Time: 02/27/22  3:55 PM  ? Specimen: Nasopharyngeal Swab; Nasopharyngeal(NP) swabs in vial transport medium  ?Result Value Ref Range Status  ? SARS Coronavirus 2 by RT PCR NEGATIVE NEGATIVE Final  ?  Comment: (NOTE) ?SARS-CoV-2 target nucleic acids are NOT DETECTED. ? ?The SARS-CoV-2 RNA is generally detectable in upper respiratory ?specimens during the acute phase of infection. The lowest ?concentration of  SARS-CoV-2 viral copies this assay can detect is ?138 copies/mL. A negative result does not preclude SARS-Cov-2 ?infection and should not be used as the sole basis for treatment or ?other patient management decisions. A negative result may occur with  ?improper specimen collection/handling, submission of specimen other ?than nasopharyngeal swab, presence of viral mutation(s) within the ?areas targeted by this assay, and inadequate number of viral ?copies(<138 copies/mL). A negative result must be combined with ?clinical observations, patient history, and epidemiological ?information. The expected result is Negative. ? ?Fact Sheet for Patients:  ?03/01/22 ? ?Fact Sheet for Healthcare Providers:  ?BloggerCourse.com ? ?This test is no t yet approved or cleared by the [place] FDA and  ?has been authorized for detection and/or diagnosis of SARS-CoV-2 by ?FDA under an Emergency Use Authorization (EUA). This EUA will remain  ?in effect (meaning this test can be used) for the duration  of the ?COVID-19 declaration under Section 564(b)(1) of the Act, 21 ?U.S.C.section 360bbb-3(b)(1), unless the authorization is terminated  ?or revoked sooner.  ? ? ?  ? Influenza A by PCR NEGATIVE NEGATIVE Final  ? Influenza B by PCR NEGATIVE NEGATIVE Final  ?  Comment: (NOTE) ?The Xpert Xpress SARS-CoV-2/FLU/RSV plus assay is intended as an aid ?in the diagnosis of influenza from Nasopharyngeal swab specimens and ?should not be used as a sole basis for treatment. Nasal washings and ?aspirates are unacceptable for Xpert Xpress SARS-CoV-2/FLU/RSV ?testing. ? ?Fact Sheet for Patients: ?BloggerCourse.comhttps://www.fda.gov/media/152166/download ? ?Fact Sheet for Healthcare Providers: ?SeriousBroker.ithttps://www.fda.gov/media/152162/download ? ?This test is not yet approved or cleared by the [place] FDA and ?has been authorized for detection and/or diagnosis of SARS-CoV-2 by ?FDA under an Emergency Use  Authorization (EUA). This EUA will remain ?in effect (meaning this test can be used) for the duration of the ?COVID-19 declaration under Section 564(b)(1) of the Act, 21 U.S.C. ?section 360bbb-3(b)(1), unless the autho

## 2022-02-27 NOTE — ED Provider Notes (Signed)
?[facility] ?Provider Note ? ? ?CSN: [id number] ?Arrival date & time: 02/27/22  1236 ? ?  ? ?History ?Chief Complaint  ?Patient presents with  ? Fever  ? Nausea  ? ? ?Gabriel Caldwell is a 56 y.o. male with polysubstance use and chronic kidney disease presents the emergency department for evaluation of nausea, vomiting, diarrhea for the past 3 days.  Patient reports he is also had a fever Tmax 104.2 for the past 3 days as well.  He has not taken any Tylenol or ibuprofen reports he has been taking "street drugs" for his fever.  He reports he uses cocaine and snorts it.  He denies any IV drug use ever.  He denies any sick contacts.  He reports that he had this area of erythema and pain to his right chest that radiated into his right axilla.  Denies any trauma to the area.  He denies any chest pain or shortness of breath.  Denies any cough or cold symptoms.  He denies any hematemesis or any coffee-ground emesis.  Denies any melena or hematochezia.  Denies any abdominal pain or any joint swelling.  He reports he has been having around 2 episodes of diarrhea per day and around 3-4 episodes of vomiting per day.  He is still urinating at least 3 times daily.  He is currently living in an RV. ? ? ?Fever ?Associated symptoms: diarrhea, nausea and vomiting   ?Associated symptoms: no chest pain, no chills, no congestion, no cough, no dysuria, no headaches, no rhinorrhea and no sore throat   ? ?  ? ?Home Medications ?Prior to Admission medications   ?Medication Sig Start Date End Date Taking? Authorizing Provider  ?glycerin adult 2 g suppository Place 1 suppository rectally as needed for constipation. 01/08/22   Renne CriglerGeiple, Joshua, PA-C  ?ibuprofen (ADVIL,MOTRIN) 800 MG tablet Take 1 tablet (800 mg total) by mouth 3 (three) times daily. 01/07/16   Barrett, Rolm GalaStevi, PA-C  ?methocarbamol (ROBAXIN) 500 MG tablet Take 1 tablet (500 mg total) by mouth 2 (two) times daily. 01/07/16   Barrett, Rolm GalaStevi, PA-C   ?OVER THE COUNTER MEDICATION CBD Oil for arthritis    [provider]  ?polyethylene glycol powder (GLYCOLAX/MIRALAX) 17 GM/SCOOP powder Take 17 g by mouth daily. 01/08/22   Renne CriglerGeiple, Joshua, PA-C  ?   ? ?Allergies    ?Bee venom, Sulfa antibiotics, Poison oak extract, and Codeine   ? ?Review of Systems   ?Review of Systems  ?Constitutional:  Positive for fever. Negative for chills.  ?HENT:  Negative for congestion, rhinorrhea and sore throat.   ?Respiratory:  Negative for cough and shortness of breath.   ?Cardiovascular:  Negative for chest pain.  ?Gastrointestinal:  Positive for diarrhea, nausea and vomiting. Negative for abdominal pain.  ?Genitourinary:  Negative for dysuria and hematuria.  ?Musculoskeletal:  Negative for joint swelling.  ?Neurological:  Negative for headaches.  ? ?Physical Exam ?Updated Vital Signs ?BP 115/74   Pulse 84   Temp 99.8 ?F (37.7 ?C) (Oral)   Resp 20   Ht 5\' 10"  (1.778 m)   Wt 74.4 kg   SpO2 97%   BMI 23.53 kg/m?  ?Physical Exam ?Vitals and nursing note reviewed.  ?Constitutional:   ?   General: He is not in acute distress. ?   Appearance: He is not toxic-appearing.  ?   Comments: Disheveled  ?HENT:  ?   Head: Normocephalic and atraumatic.  ?   Right Ear: Tympanic membrane, ear  canal and external ear normal.  ?   Left Ear: Tympanic membrane, ear canal and external ear normal.  ?   Nose:  ?   Comments: No purulence noted.  The patient has a small scabbed lesion to the septum on the right.  No ulceration or perforation of the septum. ?   Mouth/Throat:  ?   Mouth: Mucous membranes are moist.  ?   Comments: Poor dentition throughout.  Moist exam membranes.  Uvula midline.  Airway patent. ?Eyes:  ?   General: No scleral icterus. ?   Extraocular Movements: Extraocular movements intact.  ?   Pupils: Pupils are equal, round, and reactive to light.  ?Cardiovascular:  ?   Rate and Rhythm: Normal rate and regular rhythm.  ?   Pulses: Normal pulses.  ?Pulmonary:  ?   Effort: Pulmonary  effort is normal. No respiratory distress.  ?   Breath sounds: Normal breath sounds.  ?   Comments: Clear to auscultation bilaterally.  No respiratory distress, assessor muscle use, nasal flaring, cyanosis, or tripoding present.  Patient is satting well on room air without any increased work of breathing.  Patient speaking in full sentences with ease. ?Abdominal:  ?   General: Bowel sounds are normal.  ?   Palpations: Abdomen is soft.  ?   Tenderness: There is abdominal tenderness. There is no guarding or rebound.  ?   Comments: Right upper quadrant tenderness to palpation.  Normal active bowel sounds.  No guarding or rebound. No overlying skin changes noted.   ?Musculoskeletal:     ?   General: No deformity.  ?   Cervical back: Normal range of motion. No tenderness.  ?   Comments: No midline or paraspinal cervical, thoracic, or lumbar tenderness palpation.  No overlying skin changes noted other than tattoo.  No erythema or increased warmth noted to the spine or surrounding areas.  ?Lymphadenopathy:  ?   Cervical: No cervical adenopathy.  ?Skin: ?   General: Skin is warm and dry.  ?   Comments: Has an approximately palm sized area of erythema just superior and lateral to the right pectoralis.  There is no induration or fluctuance although it extremely tender to palpation.  Patient does have some refractory lymphadenopathy in the right axilla as well.  No overlying skin changes noted over the lymph node however.  There is no crepitus to the wound.  No red streaking noted.  ?Neurological:  ?   General: No focal deficit present.  ?   Mental Status: He is alert. Mental status is at baseline.  ? ? ? ? ? ?ED Results / Procedures / Treatments   ?Labs ?(all labs ordered are listed, but only abnormal results are displayed) ?Labs Reviewed  ?COMPREHENSIVE METABOLIC PANEL - Abnormal; Notable for the following components:  ?    Result Value  ? Sodium 131 (*)   ? Chloride 96 (*)   ? Glucose, Bld 127 (*)   ? BUN 28 (*)   ?  Creatinine, Ser 2.04 (*)   ? Calcium 8.8 (*)   ? Total Bilirubin 0.2 (*)   ? GFR, Estimated 38 (*)   ? All other components within normal limits  ?CBC WITH DIFFERENTIAL/PLATELET - Abnormal; Notable for the following components:  ? WBC 20.1 (*)   ? Neutro Abs 18.1 (*)   ? Monocytes Absolute 1.1 (*)   ? Abs Immature Granulocytes 0.09 (*)   ? All other components within normal limits  ?URINALYSIS, ROUTINE W  REFLEX MICROSCOPIC - Abnormal; Notable for the following components:  ? APPearance CLOUDY (*)   ? Hgb urine dipstick MODERATE (*)   ? Protein, ur 100 (*)   ? Bacteria, UA FEW (*)   ? All other components within normal limits  ?RESP PANEL BY RT-PCR (FLU A&B, COVID) [id number]  ?CULTURE, BLOOD (ROUTINE X 2)  ?CULTURE, BLOOD (ROUTINE X 2)  ?URINE CULTURE  ?LACTIC ACID, PLASMA  ?PROTIME-INR  ?APTT  ?LIPASE, BLOOD  ? ? ?EKG ?None ? ?Radiology ?DG Chest 1 View ? ?Result Date: 02/27/2022 ?CLINICAL DATA:  Chest pain, fever EXAM: CHEST  1 VIEW COMPARISON:  Radiograph 04/22/2018 FINDINGS: The heart size and mediastinal contours are within normal limits.No focal airspace disease. No pleural effusion or pneumothorax.No acute osseous abnormality. Thoracic spondylosis. IMPRESSION: No evidence of acute cardiopulmonary disease. Electronically Signed   By: Caprice Renshaw M.D.   On: 02/27/2022 13:06  ? ?CT CHEST ABDOMEN PELVIS W CONTRAST ? ?Addendum Date: 02/27/2022   ?ADDENDUM REPORT: 02/27/2022 19:53 ADDENDUM: Case discussed with Dr. Alison Murray. Patient has clinical signs of infection along the right axilla/chest wall. On CT, there are prominent right subpectoral and axillary nodes measuring up to 9 mm short axis (series 3/image 31), within the upper limits of normal, but with surrounding inflammatory stranding. This stranding extends inferiorly along the right lateral chest wall (series 3/image 46). This appearance suggests lymphadenitis with associated cellulitis. No drainable fluid collection/abscess. Electronically Signed   By: Charline Bills M.D.   On: 02/27/2022 19:53  ? ?Result Date: 02/27/2022 ?CLINICAL DATA:  Right chest/abdominal pain, fever, nausea/vomiting EXAM: CT CHEST, ABDOMEN, AND PELVIS WITH CONTRAST TECHNIQUE: Multidetector CT imaging o

## 2022-02-28 DIAGNOSIS — N179 Acute kidney failure, unspecified: Secondary | ICD-10-CM

## 2022-02-28 LAB — COMPREHENSIVE METABOLIC PANEL
ALT: 17 U/L (ref 0–44)
AST: 16 U/L (ref 15–41)
Albumin: 2.7 g/dL — ABNORMAL LOW (ref 3.5–5.0)
Alkaline Phosphatase: 53 U/L (ref 38–126)
Anion gap: 7 (ref 5–15)
BUN: 21 mg/dL — ABNORMAL HIGH (ref 6–20)
CO2: 27 mmol/L (ref 22–32)
Calcium: 8.4 mg/dL — ABNORMAL LOW (ref 8.9–10.3)
Chloride: 100 mmol/L (ref 98–111)
Creatinine, Ser: 1.7 mg/dL — ABNORMAL HIGH (ref 0.61–1.24)
GFR, Estimated: 48 mL/min — ABNORMAL LOW (ref >60)
Glucose, Bld: 120 mg/dL — ABNORMAL HIGH (ref 70–99)
Potassium: 3.9 mmol/L (ref 3.5–5.1)
Sodium: 134 mmol/L — ABNORMAL LOW (ref 135–145)
Total Bilirubin: 0.5 mg/dL (ref 0.3–1.2)
Total Protein: 5.7 g/dL — ABNORMAL LOW (ref 6.5–8.1)

## 2022-02-28 LAB — URINE CULTURE: Culture: NO GROWTH

## 2022-02-28 LAB — CBC
HCT: 39.4 % (ref 39.0–52.0)
Hemoglobin: 13.6 g/dL (ref 13.0–17.0)
MCH: 32.2 pg (ref 26.0–34.0)
MCHC: 34.5 g/dL (ref 30.0–36.0)
MCV: 93.1 fL (ref 80.0–100.0)
Platelets: 253 10*3/uL (ref 150–400)
RBC: 4.23 MIL/uL (ref 4.22–5.81)
RDW: 13.4 % (ref 11.5–15.5)
WBC: 14.5 10*3/uL — ABNORMAL HIGH (ref 4.0–10.5)
nRBC: 0 % (ref 0.0–0.2)

## 2022-02-28 LAB — MAGNESIUM: Magnesium: 2.2 mg/dL (ref 1.7–2.4)

## 2022-02-28 LAB — HIV ANTIBODY (ROUTINE TESTING W REFLEX): HIV Screen 4th Generation wRfx: NONREACTIVE

## 2022-02-28 MED ORDER — ONDANSETRON HCL 4 MG/2ML IJ SOLN
4.0000 mg | Freq: Four times a day (QID) | INTRAMUSCULAR | Status: DC | PRN
  Administered 2022-02-28: 4 mg via INTRAVENOUS
  Filled 2022-02-28: qty 2

## 2022-02-28 MED ORDER — SODIUM CHLORIDE 0.9 % IV SOLN
100.0000 mg | Freq: Two times a day (BID) | INTRAVENOUS | Status: DC
  Administered 2022-02-28 – 2022-03-01 (×3): 100 mg via INTRAVENOUS
  Filled 2022-02-28 (×4): qty 100

## 2022-02-28 MED ORDER — PROCHLORPERAZINE EDISYLATE 10 MG/2ML IJ SOLN: 10.0000 mg | Freq: Four times a day (QID) | INTRAMUSCULAR | Status: DC | PRN

## 2022-02-28 NOTE — Progress Notes (Signed)
?PROGRESS NOTE ? ? ? ?Gabriel Caldwell  CWC:[id number] DOB: [birth year] DOA: 02/27/2022 ?PCP: Pcp, No  ? ? ?Brief Narrative:  ? ?Gabriel Caldwell is a 56 year old male with past medical history significant for polysubstance abuse who presented to [facility] ED on 4/28 complaining of nausea, vomiting and diarrhea with associated fever/chills over the last 3 days.  Admits to recent cocaine use a day prior.  Denies blood in his vomitus or diarrhea.  Also reports redness and tenderness to his right chest/axillary region.  Patient reports his close friend was recently diagnosed with tickborne illness; although patient has not noted any ticks on himself.  Denies IVDU. ? ?In the ED, temperature 101.8 ?F, HR 134, RR 18, BP 136/91, SPO2 97% on room air.  WBC 20.1, hemoglobin 16.4, platelets 308.  Sodium 131, potassium 3.5, chloride 96, CO2 22, glucose 127, BUN 28, creatinine 2.04.  AST 19, ALT 19, total bilirubin 0.2.  Lactic acid 1.4.  COVID-19 PCR negative.  Influenza A/B PCR negative.  Chest x-ray with no evidence of acute cardiopulmonary disease process.  CT chest/abdomen/pelvis with mildly thick walled bladder, otherwise negative CT chest/abdomen/pelvis.  Given concern for sepsis with right chest wall cellulitis with associated fever, tachycardia; patient was started on empiric antibiotics.  Hospitalist service consulted for further evaluation and management. ? ?Assessment & Plan: ?  ?Sepsis, POA ?Right chest wall cellulitis ?Patient presenting to the ED with fever and right-sided erythema to chest that is tender to palpation.  CT chest not indicative of abscess.  WBC count elevated 20.1 on admission with associated fever of 101.8 as well as tachycardic in the setting of organ dysfunction with acute renal failure.  Urinalysis unrevealing. ?--WBC 20.1>14.5 ?--Lyme titer and RMSF: Pending ?--Blood cultures x2: Pending ?--Urine culture: Pending ?--Doxycycline 100 mg IV q12h ?--Cefepime 2 g IV q12h ?--CBC daily ? ?Acute renal  failure ?Creatinine 2.04 on admission.  No hydronephrosis appreciated on CT abdomen/pelvis.  Etiology likely secondary to prerenal azotemia in the setting of poor oral intake/dehydration versus ATN from sepsis as above. ?--Cr 2.04>1.70 ?--Avoid nephrotoxins, renally dose all medications ?--BMP daily ? ?Hyponatremia ?Etiology likely secondary to poor oral intake in the setting of nausea/vomiting/diarrhea.  Sodium 131 on admission. ?--Na 131>134 ?--IVF hydration with LR at 138mL/hr ? ?Substance abuse ?Admits to recent cocaine use.  Counseled on need for complete cessation. ? ? ? ? ? ? ?DVT prophylaxis: enoxaparin (LOVENOX) injection 40 mg Start: 02/27/22 2130 ? ?  Code Status: Full Code ?Family Communication: No family present at bedside this morning ? ?Disposition Plan:  ?Level of care: Telemetry Medical ?Status is: Inpatient ?Remains inpatient appropriate because: IV antibiotics, will need to be afebrile 24 hours before transitioning to oral antibiotics.  Continues on IV fluid hydration for AKI. ?  ? ?Consultants:  ?None ? ?Procedures:  ?None ? ?Antimicrobials:  ?Doxycycline 4/29>> ?Cefepime 4/28>> ?Vancomycin 4/28 - 4/28 ?Zosyn 4/28 - 4/28 ? ? ?Subjective: ?Patient seen examined at bedside, resting comfortably.  Continues with pain on palpation of right lateral chest/axillary region.  No other specific complaints or questions at this time.  Denies headache, no night sweats, no abdominal pain, no chest pain, no shortness of breath, no cough, no fatigue, no paresthesias.  No acute events overnight per nursing staff. ? ?Objective: ?Vitals:  ? 02/27/22 2200 02/27/22 2247 02/28/22 0316 02/28/22 0726  ?BP: 125/80 125/79 124/86 128/79  ?Pulse: 80 88 79 90  ?Resp: 14 18 17 16   ?Temp:  99.3 ?F (37.4 ?C) 99.1 ?F (  37.3 ?C) (!) 100.7 ?F (38.2 ?C)  ?TempSrc:  Oral Oral Oral  ?SpO2: 99% 100% 100% 97%  ?Weight:      ?Height:      ? ? ?Intake/Output Summary (Last 24 hours) at 02/28/2022 1155 ?Last data filed at 02/28/2022  1324 ?Gross per 24 hour  ?Intake 2993.08 ml  ?Output --  ?Net 2993.08 ml  ? ?Filed Weights  ? 02/27/22 1255  ?Weight: 74.4 kg  ? ? ?Examination: ? ?Physical Exam: ?GEN: NAD, alert and oriented x 3, chronically ill/disheveled appearance, appears older than stated age ?HEENT: NCAT, PERRL, EOMI, sclera clear, MMM, poor dentition ?PULM: CTAB w/o wheezes/crackles, normal respiratory effort, on room air ?CV: RRR w/o M/G/R ?GI: abd soft, NTND, NABS, no R/G/M ?MSK: no peripheral edema, muscle strength globally intact 5/5 bilateral upper/lower extremities ?NEURO: CN II-XII intact, no focal deficits, sensation to light touch intact ?PSYCH: normal mood/affect ?Integumentary: Faint circular erythematous lesion right upper chest/axillary region that is tender to palpation, otherwise no other appreciable concerning wounds/lesions/rashes.   ? ? ? ?Data Reviewed: I have personally reviewed following labs and imaging studies ? ?CBC: ?Recent Labs  ?Lab 02/27/22 ?1258 02/28/22 ?4010  ?WBC 20.1* 14.5*  ?NEUTROABS 18.1*  --   ?HGB 16.4 13.6  ?HCT 45.6 39.4  ?MCV 92.1 93.1  ?PLT 308 253  ? ?Basic Metabolic Panel: ?Recent Labs  ?Lab 02/27/22 ?1258 02/28/22 ?2725  ?NA 131* 134*  ?K 3.5 3.9  ?CL 96* 100  ?CO2 22 27  ?GLUCOSE 127* 120*  ?BUN 28* 21*  ?CREATININE 2.04* 1.70*  ?CALCIUM 8.8* 8.4*  ? ?GFR: ?Estimated Creatinine Clearance: 51.9 mL/min (A) (by C-G formula based on SCr of 1.7 mg/dL (H)). ?Liver Function Tests: ?Recent Labs  ?Lab 02/27/22 ?1258 02/28/22 ?3664  ?AST 19 16  ?ALT 19 17  ?ALKPHOS 63 53  ?BILITOT 0.2* 0.5  ?PROT 7.1 5.7*  ?ALBUMIN 3.6 2.7*  ? ?Recent Labs  ?Lab 02/27/22 ?1258  ?LIPASE 29  ? ?No results for input(s): AMMONIA in the last 168 hours. ?Coagulation Profile: ?Recent Labs  ?Lab 02/27/22 ?1258  ?INR 1.0  ? ?Cardiac Enzymes: ?No results for input(s): CKTOTAL, CKMB, CKMBINDEX, TROPONINI in the last 168 hours. ?BNP (last 3 results) ?No results for input(s): PROBNP in the last 8760 hours. ?HbA1C: ?No results for  input(s): HGBA1C in the last 72 hours. ?CBG: ?No results for input(s): GLUCAP in the last 168 hours. ?Lipid Profile: ?No results for input(s): CHOL, HDL, LDLCALC, TRIG, CHOLHDL, LDLDIRECT in the last 72 hours. ?Thyroid Function Tests: ?No results for input(s): TSH, T4TOTAL, FREET4, T3FREE, THYROIDAB in the last 72 hours. ?Anemia Panel: ?No results for input(s): VITAMINB12, FOLATE, FERRITIN, TIBC, IRON, RETICCTPCT in the last 72 hours. ?Sepsis Labs: ?Recent Labs  ?Lab 02/27/22 ?1258  ?LATICACIDVEN 1.4  ? ? ?Recent Results (from the past 240 hour(s))  ?Urine Culture     Status: None  ? Collection Time: 02/27/22 12:48 PM  ? Specimen: Urine, Clean Catch  ?Result Value Ref Range Status  ? Specimen Description URINE, CLEAN CATCH  Final  ? Special Requests NONE  Final  ? Culture   Final  ?  NO GROWTH ?Performed at [facility], [address] [place], [place] [address] ?  ? Report Status 02/28/2022 FINAL  Final  ?Blood Culture (routine x 2)     Status: None (Preliminary result)  ? Collection Time: 02/27/22 12:58 PM  ? Specimen: BLOOD  ?Result Value Ref Range Status  ? Specimen Description BLOOD LEFT  ANTECUBITAL  Final  ? Special Requests   Final  ?  BOTTLES DRAWN AEROBIC AND ANAEROBIC Blood Culture adequate volume  ? Culture   Final  ?  NO GROWTH < 24 HOURS ?Performed at [facility], [address] [place], [place] [address] ?  ? Report Status PENDING  Incomplete  ?Blood Culture (routine x 2)     Status: None (Preliminary result)  ? Collection Time: 02/27/22 12:59 PM  ? Specimen: BLOOD  ?Result Value Ref Range Status  ? Specimen Description BLOOD BLOOD RIGHT FOREARM  Final  ? Special Requests   Final  ?  BOTTLES DRAWN AEROBIC AND ANAEROBIC Blood Culture adequate volume  ? Culture   Final  ?  NO GROWTH < 24 HOURS ?Performed at [facility], [address] [place], [place] [address] ?  ? Report Status PENDING  Incomplete  ?Resp Panel by RT-PCR (Flu A&B, Covid) Nasopharyngeal Swab     Status: None  ?  Collection Time: 02/27/22  3:55 PM  ? Specimen: Nasopharyngeal Swab; Nasopharyngeal(NP) swabs in vial transport medium  ?Result Value Ref Range Status  ? SARS Coronavirus 2 by RT PCR NEGATIVE NEGATIVE Final  ?  Comment: (NOT

## 2022-02-28 NOTE — Progress Notes (Signed)
Pt had 8 beats of VT at 1517 which Luisa Hart, the Rapid Response nurse and I reviewed and Dr. [place] was notified. Pt was asymptomatic at the time. Will continue to monitor. ?

## 2022-03-01 LAB — CBC
HCT: 38.2 % — ABNORMAL LOW (ref 39.0–52.0)
Hemoglobin: 12.9 g/dL — ABNORMAL LOW (ref 13.0–17.0)
MCH: 31.6 pg (ref 26.0–34.0)
MCHC: 33.8 g/dL (ref 30.0–36.0)
MCV: 93.6 fL (ref 80.0–100.0)
Platelets: 237 10*3/uL (ref 150–400)
RBC: 4.08 MIL/uL — ABNORMAL LOW (ref 4.22–5.81)
RDW: 13.4 % (ref 11.5–15.5)
WBC: 9.4 10*3/uL (ref 4.0–10.5)
nRBC: 0 % (ref 0.0–0.2)

## 2022-03-01 LAB — BASIC METABOLIC PANEL
Anion gap: 7 (ref 5–15)
BUN: 13 mg/dL (ref 6–20)
CO2: 26 mmol/L (ref 22–32)
Calcium: 8.2 mg/dL — ABNORMAL LOW (ref 8.9–10.3)
Chloride: 107 mmol/L (ref 98–111)
Creatinine, Ser: 1.27 mg/dL — ABNORMAL HIGH (ref 0.61–1.24)
GFR, Estimated: >60 mL/min (ref >60)
Glucose, Bld: 137 mg/dL — ABNORMAL HIGH (ref 70–99)
Potassium: 3.3 mmol/L — ABNORMAL LOW (ref 3.5–5.1)
Sodium: 140 mmol/L (ref 135–145)

## 2022-03-01 MED ORDER — ONDANSETRON HCL 4 MG PO TABS: 4.0000 mg | ORAL_TABLET | Freq: Every day | ORAL | 1 refills | Status: AC | PRN

## 2022-03-01 MED ORDER — POTASSIUM CHLORIDE CRYS ER 20 MEQ PO TBCR
40.0000 meq | EXTENDED_RELEASE_TABLET | Freq: Once | ORAL | Status: AC
  Administered 2022-03-01: 40 meq via ORAL
  Filled 2022-03-01: qty 2

## 2022-03-01 MED ORDER — DOXYCYCLINE HYCLATE 100 MG PO TBEC
100.0000 mg | DELAYED_RELEASE_TABLET | Freq: Two times a day (BID) | ORAL | 0 refills | Status: AC
Start: 2022-03-01 — End: 2022-03-20

## 2022-03-01 MED ORDER — CEFDINIR 300 MG PO CAPS: 300.0000 mg | ORAL_CAPSULE | Freq: Two times a day (BID) | ORAL | 0 refills | Status: AC

## 2022-03-01 NOTE — Discharge Summary (Signed)
?Physician Discharge Summary  ?Gabriel Caldwell CVE:[id number] DOB: 09/28/1966 DOA: 02/27/2022 ? ?PCP: Pcp, No ? ?Admit date: 02/27/2022 ?Discharge date: 03/01/2022 ? ?Admitted From: Home ?Disposition: Home ? ?Recommendations for Outpatient Follow-up:  ?Follow up with PCP in 1-2 weeks ?Continue doxycycline 100 mg p.o. twice daily for total 21 days for presumed Lyme's disease treatment ?Continue cefdinir to complete 10-day course of antibiotics for right chest/axillary cellulitis ?Continue to encourage cocaine cessation ?Follow-up Lyme titer and RMSF that were pending at time of discharge ? ?Home Health: No ?Equipment/Devices: None ? ?Discharge Condition: Stable ?CODE STATUS: Full code ?Diet recommendation: Regular diet ? ?History of present illness: ? ?Gabriel Caldwell is a 56 year old male with past medical history significant for polysubstance abuse who presented to [facility] ED on 4/28 complaining of nausea, vomiting and diarrhea with associated fever/chills over the last 3 days.  Admits to recent cocaine use a day prior.  Denies blood in his vomitus or diarrhea.  Also reports redness and tenderness to his right chest/axillary region.  Patient reports his close friend was recently diagnosed with tickborne illness; although patient has not noted any ticks on himself.  Denies IVDU. ?  ?In the ED, temperature 101.8 ?F, HR 134, RR 18, BP 136/91, SPO2 97% on room air.  WBC 20.1, hemoglobin 16.4, platelets 308.  Sodium 131, potassium 3.5, chloride 96, CO2 22, glucose 127, BUN 28, creatinine 2.04.  AST 19, ALT 19, total bilirubin 0.2.  Lactic acid 1.4.  COVID-19 PCR negative.  Influenza A/B PCR negative.  Chest x-ray with no evidence of acute cardiopulmonary disease process.  CT chest/abdomen/pelvis with mildly thick walled bladder, otherwise negative CT chest/abdomen/pelvis.  Given concern for sepsis with right chest wall cellulitis with associated fever, tachycardia; patient was started on empiric antibiotics.  Hospitalist  service consulted for further evaluation and management. ? ?Hospital course: ? ? ?Sepsis, POA ?Right chest wall cellulitis ?Possible Lyme's disease ?Patient presenting to the ED with fever and right-sided erythema to chest that is tender to palpation.  CT chest not indicative of abscess.  WBC count elevated 20.1 on admission with associated fever of 101.8 as well as tachycardic in the setting of organ dysfunction with acute renal failure.  Urinalysis unrevealing.  Patient was started on IV doxycycline and cefepime.  Patient's fever resolved as well as WBC count improved to 9.4 at time of discharge.  Lyme titer and RMSF were pending at time of discharge.  We will continue doxycycline 100 mg p.o. twice daily to complete 21-day course for presumed Lyme's disease as well as cefdinir 3 mg p.o. twice daily to complete 10-day course for right chest/axillary cellulitis. ?  ?Acute renal failure ?Creatinine 2.04 on admission.  No hydronephrosis appreciated on CT abdomen/pelvis.  Etiology likely secondary to prerenal azotemia in the setting of poor oral intake/dehydration versus ATN from sepsis as above.  Patient was started on IV fluid hydration with improvement of creatinine to 1.27 at time of discharge.  Continue to encourage increased oral intake. ?  ?Hyponatremia ?Etiology likely secondary to poor oral intake in the setting of nausea/vomiting/diarrhea.  Sodium 131 on admission.  Supported with IV fluid hydration with improvement of sodium to 140 at time of discharge. ? ?Hypokalemia ?Repleted during hospitalization.  Etiology likely secondary to poor oral intake in the setting of nausea/vomiting. ?  ?Substance abuse ?Admits to recent cocaine use.  Counseled on need for complete cessation. ? ? ? ? ?Discharge Diagnoses:  ?Principal Problem: ?  Cellulitis ?Active Problems: ?  ARF (  acute renal failure) (HCC) ? ? ? ?Discharge Instructions ? ?Discharge Instructions   ? ? Call MD for:  difficulty breathing, headache or visual  disturbances   Complete by: As directed ?  ? Call MD for:  extreme fatigue   Complete by: As directed ?  ? Call MD for:  persistant dizziness or light-headedness   Complete by: As directed ?  ? Call MD for:  persistant nausea and vomiting   Complete by: As directed ?  ? Call MD for:  severe uncontrolled pain   Complete by: As directed ?  ? Call MD for:  temperature >100.4   Complete by: As directed ?  ? Diet - low sodium heart healthy   Complete by: As directed ?  ? Increase activity slowly   Complete by: As directed ?  ? ?  ? ?Allergies as of 03/01/2022   ? ?   Reactions  ? Bee Venom Anaphylaxis  ? Sulfa Antibiotics Hives  ? Poison Oak Extract Swelling, Rash  ? Codeine Nausea And Vomiting, Other (See Comments)  ? Headache   ? ?  ? ?  ?Medication List  ?  ? ?STOP taking these medications   ? ?glycerin adult 2 g suppository ?  ?ibuprofen 800 MG tablet ?Commonly known as: ADVIL ?  ?methocarbamol 500 MG tablet ?Commonly known as: ROBAXIN ?  ?polyethylene glycol powder 17 GM/SCOOP powder ?Commonly known as: GLYCOLAX/MIRALAX ?  ? ?  ? ?TAKE these medications   ? ?cefdinir 300 MG capsule ?Commonly known as: OMNICEF ?Take 1 capsule (300 mg total) by mouth 2 (two) times daily for 8 days. ?  ?doxycycline 100 MG EC tablet ?Commonly known as: DORYX ?Take 1 tablet (100 mg total) by mouth 2 (two) times daily for 19 days. ?  ?ondansetron 4 MG tablet ?Commonly known as: Zofran ?Take 1 tablet (4 mg total) by mouth daily as needed for nausea or vomiting. ?  ? ?  ? ? ?Allergies  ?Allergen Reactions  ? Bee Venom Anaphylaxis  ? Sulfa Antibiotics Hives  ? Poison Oak Extract Swelling and Rash  ? Codeine Nausea And Vomiting and Other (See Comments)  ?  Headache   ? ? ?Consultations: ?None ? ? ?Procedures/Studies: ?DG Chest 1 View ? ?Result Date: 02/27/2022 ?CLINICAL DATA:  Chest pain, fever EXAM: CHEST  1 VIEW COMPARISON:  Radiograph 04/22/2018 FINDINGS: The heart size and mediastinal contours are within normal limits.No focal airspace  disease. No pleural effusion or pneumothorax.No acute osseous abnormality. Thoracic spondylosis. IMPRESSION: No evidence of acute cardiopulmonary disease. Electronically Signed   By: Caprice RenshawJacob  Kahn M.D.   On: 02/27/2022 13:06  ? ?CT CHEST ABDOMEN PELVIS W CONTRAST ? ?Addendum Date: 02/27/2022   ?ADDENDUM REPORT: 02/27/2022 19:53 ADDENDUM: Case discussed with Dr. Alison Murrayansom. Patient has clinical signs of infection along the right axilla/chest wall. On CT, there are prominent right subpectoral and axillary nodes measuring up to 9 mm short axis (series 3/image 31), within the upper limits of normal, but with surrounding inflammatory stranding. This stranding extends inferiorly along the right lateral chest wall (series 3/image 46). This appearance suggests lymphadenitis with associated cellulitis. No drainable fluid collection/abscess. Electronically Signed   By: Charline BillsSriyesh  Krishnan M.D.   On: 02/27/2022 19:53  ? ?Result Date: 02/27/2022 ?CLINICAL DATA:  Right chest/abdominal pain, fever, nausea/vomiting EXAM: CT CHEST, ABDOMEN, AND PELVIS WITH CONTRAST TECHNIQUE: Multidetector CT imaging of the chest, abdomen and pelvis was performed following the standard protocol during bolus administration of intravenous contrast. RADIATION DOSE  REDUCTION: This exam was performed according to the departmental dose-optimization program which includes automated exposure control, adjustment of the mA and/or kV according to patient size and/or use of iterative reconstruction technique. CONTRAST:  86mL OMNIPAQUE IOHEXOL 300 MG/ML  SOLN COMPARISON:  CT abdomen/pelvis dated 01/08/2022. CT chest dated 04/22/2018. FINDINGS: CT CHEST FINDINGS Cardiovascular: Heart is normal in size.  No pericardial effusion. No evidence thoracic aortic aneurysm. Mild atherosclerotic calcifications of the aortic arch. Mediastinum/Nodes: No suspicious mediastinal lymphadenopathy. Visualized thyroid is unremarkable. Lungs/Pleura: Mild paraseptal emphysematous changes,  upper lung predominant. No suspicious pulmonary nodules. Mild dependent atelectasis in the bilateral lower lobes. No focal consolidation. No pleural effusion or pneumothorax. Musculoskeletal: Mild degenerative chan

## 2022-03-01 NOTE — Progress Notes (Signed)
Hervey Ard to be D/C'd  per MD order.  Discussed with the patient and all questions fully answered. ? ?VSS, Skin clean, dry and intact without evidence of skin break down, no evidence of skin tears noted. ? ?IV catheter discontinued intact. Site without signs and symptoms of complications. Dressing and pressure applied. ? ?An After Visit Summary was printed and given to the patient. ? ?D/c re-educate completed with patient/family including follow up instructions, medication list, d/c activities limitations if indicated, with other d/c instructions as indicated by MD - patient able to verbalize understanding, all questions fully answered.  ? ?Patient instructed to return to ED, call 911, or call MD for any changes in condition.  ? ?Patient to be escorted via WC, and D/C home via private auto.  ?

## 2022-03-02 LAB — LYME DISEASE SEROLOGY W/REFLEX: Lyme Total Antibody EIA: NEGATIVE

## 2022-03-03 LAB — ROCKY MTN SPOTTED FVR ABS PNL(IGG+IGM)
RMSF IgG: NEGATIVE
RMSF IgM: 0.32 index (ref 0.00–0.89)

## 2022-03-04 LAB — CULTURE, BLOOD (ROUTINE X 2)
Culture: NO GROWTH
Culture: NO GROWTH
Special Requests: ADEQUATE
Special Requests: ADEQUATE

## 2023-08-07 ENCOUNTER — Encounter (HOSPITAL_BASED_OUTPATIENT_CLINIC_OR_DEPARTMENT_OTHER): Payer: Self-pay

## 2023-08-07 ENCOUNTER — Emergency Department (HOSPITAL_BASED_OUTPATIENT_CLINIC_OR_DEPARTMENT_OTHER)
Admission: EM | Admit: 2023-08-07 | Discharge: 2023-08-07 | Disposition: A | Discharge status: Home / Self Care | Payer: Medicaid Other

## 2023-08-07 ENCOUNTER — Other Ambulatory Visit: Payer: Self-pay

## 2023-08-07 DIAGNOSIS — H5711 Ocular pain, right eye: Secondary | ICD-10-CM | POA: Insufficient documentation

## 2023-08-07 MED ORDER — NEOMYCIN-POLYMYXIN-DEXAMETH 3.5-10000-0.1 OP SUSP: 1.0000 [drp] | OPHTHALMIC | 0 refills | Status: AC

## 2023-08-07 MED ORDER — TETRACAINE HCL 0.5 % OP SOLN
1.0000 [drp] | Freq: Once | OPHTHALMIC | Status: AC
  Administered 2023-08-07: 1 [drp] via OPHTHALMIC
  Filled 2023-08-07: qty 4

## 2023-08-07 MED ORDER — FLUORESCEIN SODIUM 1 MG OP STRP
1.0000 | ORAL_STRIP | Freq: Once | OPHTHALMIC | Status: AC
  Administered 2023-08-07: 1 via OPHTHALMIC
  Filled 2023-08-07: qty 1

## 2023-08-07 MED ORDER — NEOMYCIN-POLYMYXIN-DEXAMETH 3.5-10000-0.1 OP SUSP: 1.0000 [drp] | OPHTHALMIC | 0 refills | Status: DC

## 2023-08-07 NOTE — Discharge Instructions (Addendum)
As discussed, please follow-up with ophthalmology.  Return if you develop any new or worsening symptoms.
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
## 2023-08-07 NOTE — ED Notes (Signed)
Dc instructions reviewed with patient. Patient voiced understanding. Dc with belongings.  °
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
## 2023-09-03 ENCOUNTER — Ambulatory Visit (HOSPITAL_COMMUNITY): Payer: Medicaid Other | Admitting: Student

## 2024-09-05 ENCOUNTER — Inpatient Hospital Stay (HOSPITAL_COMMUNITY): Admitting: Anesthesiology

## 2024-09-05 ENCOUNTER — Inpatient Hospital Stay (HOSPITAL_COMMUNITY)
Admission: EM | Admit: 2024-09-05 | Discharge: 2024-09-09 | DRG: 522 | Disposition: A | Discharge status: Home / Self Care | Attending: Family Medicine | Admitting: Family Medicine

## 2024-09-05 ENCOUNTER — Emergency Department (HOSPITAL_COMMUNITY)

## 2024-09-05 ENCOUNTER — Encounter (HOSPITAL_COMMUNITY): Payer: Self-pay | Admitting: *Deleted

## 2024-09-05 ENCOUNTER — Other Ambulatory Visit: Payer: Self-pay

## 2024-09-05 ENCOUNTER — Inpatient Hospital Stay (HOSPITAL_COMMUNITY)

## 2024-09-05 DIAGNOSIS — S72011A Unspecified intracapsular fracture of right femur, initial encounter for closed fracture: Secondary | ICD-10-CM | POA: Diagnosis present

## 2024-09-05 DIAGNOSIS — M1611 Unilateral primary osteoarthritis, right hip: Secondary | ICD-10-CM | POA: Diagnosis present

## 2024-09-05 DIAGNOSIS — Z91048 Other nonmedicinal substance allergy status: Secondary | ICD-10-CM

## 2024-09-05 DIAGNOSIS — Z885 Allergy status to narcotic agent status: Secondary | ICD-10-CM

## 2024-09-05 DIAGNOSIS — S728X1A Other fracture of right femur, initial encounter for closed fracture: Secondary | ICD-10-CM | POA: Diagnosis not present

## 2024-09-05 DIAGNOSIS — Z91030 Bee allergy status: Secondary | ICD-10-CM

## 2024-09-05 DIAGNOSIS — G40909 Epilepsy, unspecified, not intractable, without status epilepticus: Secondary | ICD-10-CM | POA: Diagnosis present

## 2024-09-05 DIAGNOSIS — Z882 Allergy status to sulfonamides status: Secondary | ICD-10-CM | POA: Diagnosis not present

## 2024-09-05 DIAGNOSIS — Y92410 Unspecified street and highway as the place of occurrence of the external cause: Secondary | ICD-10-CM

## 2024-09-05 DIAGNOSIS — F17210 Nicotine dependence, cigarettes, uncomplicated: Secondary | ICD-10-CM | POA: Diagnosis present

## 2024-09-05 LAB — CBC WITH DIFFERENTIAL/PLATELET
Abs Immature Granulocytes: 0.11 K/uL — ABNORMAL HIGH (ref 0.00–0.07)
Basophils Absolute: 0.1 K/uL (ref 0.0–0.1)
Basophils Relative: 1 %
Eosinophils Absolute: 0.1 K/uL (ref 0.0–0.5)
Eosinophils Relative: 2 %
HCT: 51.8 % (ref 39.0–52.0)
Hemoglobin: 17.1 g/dL — ABNORMAL HIGH (ref 13.0–17.0)
Immature Granulocytes: 1 %
Lymphocytes Relative: 17 %
Lymphs Abs: 1.4 K/uL (ref 0.7–4.0)
MCH: 30.7 pg (ref 26.0–34.0)
MCHC: 33 g/dL (ref 30.0–36.0)
MCV: 93 fL (ref 80.0–100.0)
Monocytes Absolute: 0.5 K/uL (ref 0.1–1.0)
Monocytes Relative: 7 %
Neutro Abs: 6 K/uL (ref 1.7–7.7)
Neutrophils Relative %: 72 %
Platelets: 308 K/uL (ref 150–400)
RBC: 5.57 MIL/uL (ref 4.22–5.81)
RDW: 13.3 % (ref 11.5–15.5)
WBC: 8.3 K/uL (ref 4.0–10.5)
nRBC: 0 % (ref 0.0–0.2)

## 2024-09-05 LAB — BASIC METABOLIC PANEL WITH GFR
Anion gap: 12 (ref 5–15)
BUN: 17 mg/dL (ref 6–20)
CO2: 21 mmol/L — ABNORMAL LOW (ref 22–32)
Calcium: 8.6 mg/dL — ABNORMAL LOW (ref 8.9–10.3)
Chloride: 103 mmol/L (ref 98–111)
Creatinine, Ser: 0.85 mg/dL (ref 0.61–1.24)
GFR, Estimated: >60 mL/min (ref >60)
Glucose, Bld: 107 mg/dL — ABNORMAL HIGH (ref 70–99)
Potassium: 4.2 mmol/L (ref 3.5–5.1)
Sodium: 136 mmol/L (ref 135–145)

## 2024-09-05 LAB — RAPID URINE DRUG SCREEN, HOSP PERFORMED
Amphetamines: NOT DETECTED
Barbiturates: NOT DETECTED
Benzodiazepines: NOT DETECTED
Cocaine: NOT DETECTED
Opiates: NOT DETECTED
Tetrahydrocannabinol: NOT DETECTED

## 2024-09-05 LAB — ETHANOL: Alcohol, Ethyl (B): <15 mg/dL (ref <15)

## 2024-09-05 LAB — I-STAT CG4 LACTIC ACID, ED: Lactic Acid, Venous: 0.9 mmol/L (ref 0.5–1.9)

## 2024-09-05 LAB — TYPE AND SCREEN
ABO/RH(D): A POS
Antibody Screen: NEGATIVE

## 2024-09-05 LAB — PROTIME-INR
INR: 0.9 (ref 0.8–1.2)
Prothrombin Time: 12.7 s (ref 11.4–15.2)

## 2024-09-05 LAB — HIV ANTIBODY (ROUTINE TESTING W REFLEX): HIV Screen 4th Generation wRfx: NONREACTIVE

## 2024-09-05 LAB — SURGICAL PCR SCREEN
MRSA, PCR: NEGATIVE
Staphylococcus aureus: NEGATIVE

## 2024-09-05 LAB — MAGNESIUM: Magnesium: 1.9 mg/dL (ref 1.7–2.4)

## 2024-09-05 MED ORDER — HYDROMORPHONE HCL 1 MG/ML IJ SOLN
0.5000 mg | INTRAMUSCULAR | Status: AC | PRN
  Administered 2024-09-05 (×3): 0.5 mg via INTRAVENOUS
  Filled 2024-09-05: qty 1
  Filled 2024-09-05: qty 0.5
  Filled 2024-09-05: qty 1

## 2024-09-05 MED ORDER — NICOTINE 21 MG/24HR TD PT24
21.0000 mg | MEDICATED_PATCH | Freq: Every day | TRANSDERMAL | Status: DC
  Administered 2024-09-05 – 2024-09-09 (×5): 21 mg via TRANSDERMAL
  Filled 2024-09-05 (×6): qty 1

## 2024-09-05 MED ORDER — MIDAZOLAM HCL 2 MG/2ML IJ SOLN
INTRAMUSCULAR | Status: AC
  Filled 2024-09-05: qty 2

## 2024-09-05 MED ORDER — IOHEXOL 350 MG/ML SOLN
75.0000 mL | Freq: Once | INTRAVENOUS | Status: AC | PRN
  Administered 2024-09-05: 75 mL via INTRAVENOUS

## 2024-09-05 MED ORDER — METHOCARBAMOL 1000 MG/10ML IJ SOLN
500.0000 mg | Freq: Four times a day (QID) | INTRAMUSCULAR | Status: DC | PRN
  Administered 2024-09-05 – 2024-09-07 (×2): 500 mg via INTRAVENOUS
  Filled 2024-09-05: qty 10

## 2024-09-05 MED ORDER — FENTANYL CITRATE (PF) 50 MCG/ML IJ SOSY
25.0000 ug | PREFILLED_SYRINGE | Freq: Once | INTRAMUSCULAR | Status: AC
  Administered 2024-09-05: 25 ug via INTRAVENOUS
  Filled 2024-09-05: qty 1

## 2024-09-05 MED ORDER — HYDROMORPHONE HCL 1 MG/ML IJ SOLN
INTRAMUSCULAR | Status: AC
  Administered 2024-09-05: 1 mg via INTRAVENOUS
  Filled 2024-09-05: qty 1

## 2024-09-05 MED ORDER — HYDROCODONE-ACETAMINOPHEN 5-325 MG PO TABS
1.0000 | ORAL_TABLET | Freq: Four times a day (QID) | ORAL | Status: DC | PRN
  Administered 2024-09-05 – 2024-09-07 (×6): 2 via ORAL
  Filled 2024-09-05 (×6): qty 2

## 2024-09-05 MED ORDER — HYDROMORPHONE HCL 1 MG/ML IJ SOLN: 1.0000 mg | Freq: Once | INTRAMUSCULAR | Status: AC

## 2024-09-05 MED ORDER — DIAZEPAM 5 MG/ML IJ SOLN
2.5000 mg | Freq: Once | INTRAMUSCULAR | Status: AC
  Administered 2024-09-05: 2.5 mg via INTRAVENOUS
  Filled 2024-09-05: qty 2

## 2024-09-05 MED ORDER — BUPIVACAINE-EPINEPHRINE (PF) 0.5% -1:200000 IJ SOLN
INTRAMUSCULAR | Status: DC | PRN
  Administered 2024-09-05: 30 mL

## 2024-09-05 MED ORDER — METHOCARBAMOL 500 MG PO TABS
500.0000 mg | ORAL_TABLET | Freq: Four times a day (QID) | ORAL | Status: DC | PRN
  Administered 2024-09-05 – 2024-09-07 (×5): 500 mg via ORAL
  Filled 2024-09-05 (×6): qty 1

## 2024-09-05 MED ORDER — POLYETHYLENE GLYCOL 3350 17 G PO PACK
17.0000 g | PACK | Freq: Every day | ORAL | Status: DC | PRN
Start: 2024-09-05 — End: 2024-09-09
  Administered 2024-09-07 – 2024-09-08 (×2): 17 g via ORAL
  Filled 2024-09-05 (×2): qty 1

## 2024-09-05 MED ORDER — BISACODYL 5 MG PO TBEC
5.0000 mg | DELAYED_RELEASE_TABLET | Freq: Every day | ORAL | Status: DC | PRN
  Administered 2024-09-06 – 2024-09-08 (×3): 5 mg via ORAL
  Filled 2024-09-05 (×3): qty 1

## 2024-09-05 MED ORDER — HYDROMORPHONE HCL 1 MG/ML IJ SOLN
0.5000 mg | INTRAMUSCULAR | Status: AC | PRN
  Administered 2024-09-05 (×2): 0.5 mg via INTRAVENOUS
  Filled 2024-09-05: qty 1

## 2024-09-05 MED ORDER — ONDANSETRON HCL 4 MG/2ML IJ SOLN
4.0000 mg | Freq: Once | INTRAMUSCULAR | Status: AC
  Administered 2024-09-05: 4 mg via INTRAVENOUS
  Filled 2024-09-05: qty 2

## 2024-09-05 NOTE — Progress Notes (Signed)
 Ortho tech called for trapeze bar.

## 2024-09-05 NOTE — ED Notes (Signed)
 C/o L rib pain, sudden, recurrent, unchanging. EKG repeated.

## 2024-09-05 NOTE — Progress Notes (Signed)
 Orthopedic Tech Progress Note Patient Details:  Gabriel Caldwell 1966/04/22 [id number] Applied over head trapeze bar per order.  Ortho Devices Ortho Device/Splint Location: femur Ortho Device/Splint Interventions: Ordered, Application, Adjustment   Post Interventions Instructions Provided: Adjustment of device, Care of device, Poper ambulation with device  Morna Pink 09/05/2024, 10:49 PM

## 2024-09-05 NOTE — ED Notes (Signed)
 EDP at [facility], pending ortho consult post films

## 2024-09-05 NOTE — ED Notes (Signed)
 CCMD called and informed of pt transfer to [id number].

## 2024-09-05 NOTE — ED Notes (Signed)
 Clothes cut per pt. Belongings given to friend at [facility].

## 2024-09-05 NOTE — Plan of Care (Signed)
  Problem: Coping: Goal: Level of anxiety will decrease Outcome: Not Progressing   Problem: Pain Managment: Goal: General experience of comfort will improve and/or be controlled Outcome: Not Progressing   Problem: Safety: Goal: Ability to remain free from injury will improve Outcome: Not Progressing   Problem: Skin Integrity: Goal: Risk for impaired skin integrity will decrease Outcome: Not Progressing

## 2024-09-05 NOTE — ED Notes (Signed)
 EDP speaking with ortho PA at [facility]
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
## 2024-09-05 NOTE — ED Notes (Signed)
 To CT2 with RN, on monitor. No changes. Resting comfortably when still. Alert, NAD, calm, interactive. Friend at [facility], in resus exam room
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
## 2024-09-05 NOTE — ED Notes (Signed)
 Pt requested Nicotine patch. Tobie, MD notified.

## 2024-09-05 NOTE — ED Notes (Signed)
 EDP at [facility]
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
## 2024-09-05 NOTE — Progress Notes (Signed)
 Patient in PACU for right  femoral nerve block. Versed  2 mg IV given per  Dr. Keneth .
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
## 2024-09-05 NOTE — ED Notes (Signed)
 Xray called for portables

## 2024-09-05 NOTE — ED Notes (Signed)
 Xray at [facility] for R knee film. No changes. VSS. Pt talking on phone. Rates pain 7/10. Pain worse with movement, better and calm at rest.

## 2024-09-05 NOTE — ED Notes (Signed)
 Portable xray in progress. EDP present.
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
## 2024-09-05 NOTE — Progress Notes (Signed)
 Patient has excruciating pain on his right leg, became very agitated and physically harmful to himself by hitting his face. States he is redirecting his pain from legs to other area, of note patient just got his 10 mg norco. Patient became verbally abusive to this nurse when told to stop harming himself. Informed Lynwood Kipper of behavior and pain of patient. Fentanyl  ordered.

## 2024-09-05 NOTE — ED Notes (Signed)
 CMS intact, R hip leg ROM limited, abrasion to R knee.

## 2024-09-05 NOTE — Anesthesia Preprocedure Evaluation (Signed)
 Anesthesia Evaluation  Patient identified by MRN, date of birth, ID band Patient awake    Reviewed: Allergy & Precautions, NPO status , Patient's Chart, lab work & pertinent test results, reviewed documented beta blocker date and time   History of Anesthesia Complications (+) PONV and history of anesthetic complications  Airway Mallampati: II  TM Distance: >3 FB     Dental  (+) Poor Dentition   Pulmonary neg COPD, Current Smoker   breath sounds clear to auscultation       Cardiovascular (-) hypertension(-) Past MI, (-) Cardiac Stents and (-) CABG  Rhythm:Regular Rate:Normal     Neuro/Psych Seizures -,  PSYCHIATRIC DISORDERS         GI/Hepatic ,,,(+)     substance abuse    Endo/Other    Renal/GU CRFRenal disease     Musculoskeletal  (+) Arthritis ,    Abdominal   Peds  Hematology   Anesthesia Other Findings   Reproductive/Obstetrics                              Anesthesia Physical Anesthesia Plan  ASA: 3  Anesthesia Plan: Regional   Post-op Pain Management: Regional block*   Induction:   PONV Risk Score and Plan:   Airway Management Planned:   Additional Equipment:   Intra-op Plan:   Post-operative Plan:   Informed Consent: I have reviewed the patients History and Physical, chart, labs and discussed the procedure including the risks, benefits and alternatives for the proposed anesthesia with the patient or authorized representative who has indicated his/her understanding and acceptance.       Plan Discussed with:   Anesthesia Plan Comments:          Anesthesia Quick Evaluation

## 2024-09-05 NOTE — ED Notes (Signed)
 Pain with xrays

## 2024-09-05 NOTE — ED Notes (Signed)
 Back from CT, no changes, alert, NAD, calm, intermittent pain fluctuates. Attempting urinal. VSS.

## 2024-09-05 NOTE — Anesthesia Procedure Notes (Signed)
 Anesthesia Regional Block: Femoral nerve block   Pre-Anesthetic Checklist: , timeout performed,  Correct Patient, Correct Site, Correct Laterality,  Correct Procedure, Correct Position, site marked,  Risks and benefits discussed,  Surgical consent,  Pre-op evaluation,  At surgeon's request and post-op pain management  Laterality: Right  Prep: Maximum Sterile Barrier Precautions used, chloraprep       Needles:  Injection technique: Single-shot  Needle Type: Echogenic Needle      Needle Gauge: 20     Additional Needles:   Procedures:,,,, ultrasound used (permanent image in chart),,    Narrative:  Start time: 09/05/2024 4:20 PM End time: 09/05/2024 4:25 PM Injection made incrementally with aspirations every 5 mL.  Performed by: Personally  Anesthesiologist: Keneth Lynwood POUR, MD

## 2024-09-05 NOTE — ED Notes (Signed)
 BLE CMS intact. ROM limited d/t pain R leg. Pedal pulses strong. Unable to void. Bladder full, mentions prostate issues.

## 2024-09-06 DIAGNOSIS — S728X1A Other fracture of right femur, initial encounter for closed fracture: Secondary | ICD-10-CM | POA: Diagnosis not present

## 2024-09-06 LAB — CBC
HCT: 50.1 % (ref 39.0–52.0)
Hemoglobin: 16.8 g/dL (ref 13.0–17.0)
MCH: 30.9 pg (ref 26.0–34.0)
MCHC: 33.5 g/dL (ref 30.0–36.0)
MCV: 92.1 fL (ref 80.0–100.0)
Platelets: 284 K/uL (ref 150–400)
RBC: 5.44 MIL/uL (ref 4.22–5.81)
RDW: 13.3 % (ref 11.5–15.5)
WBC: 13.7 K/uL — ABNORMAL HIGH (ref 4.0–10.5)
nRBC: 0 % (ref 0.0–0.2)

## 2024-09-06 LAB — BASIC METABOLIC PANEL WITH GFR
Anion gap: 12 (ref 5–15)
BUN: 13 mg/dL (ref 6–20)
CO2: 23 mmol/L (ref 22–32)
Calcium: 8.5 mg/dL — ABNORMAL LOW (ref 8.9–10.3)
Chloride: 100 mmol/L (ref 98–111)
Creatinine, Ser: 0.85 mg/dL (ref 0.61–1.24)
GFR, Estimated: >60 mL/min (ref >60)
Glucose, Bld: 117 mg/dL — ABNORMAL HIGH (ref 70–99)
Potassium: 4.1 mmol/L (ref 3.5–5.1)
Sodium: 135 mmol/L (ref 135–145)

## 2024-09-06 MED ORDER — SIMETHICONE 80 MG PO CHEW
80.0000 mg | CHEWABLE_TABLET | Freq: Once | ORAL | Status: AC
  Administered 2024-09-06: 80 mg via ORAL
  Filled 2024-09-06: qty 1

## 2024-09-06 MED ORDER — SIMETHICONE 80 MG PO CHEW: 80.0000 mg | CHEWABLE_TABLET | Freq: Four times a day (QID) | ORAL | Status: DC | PRN

## 2024-09-06 MED ORDER — ONDANSETRON HCL 4 MG/2ML IJ SOLN
4.0000 mg | Freq: Once | INTRAMUSCULAR | Status: AC
  Administered 2024-09-06: 4 mg via INTRAVENOUS
  Filled 2024-09-06: qty 2

## 2024-09-06 MED ORDER — ONDANSETRON HCL 4 MG/2ML IJ SOLN
4.0000 mg | Freq: Four times a day (QID) | INTRAMUSCULAR | Status: DC | PRN
Start: 2024-09-06 — End: 2024-09-09
  Administered 2024-09-06: 4 mg via INTRAVENOUS
  Filled 2024-09-06: qty 2

## 2024-09-06 MED ORDER — HYDROMORPHONE HCL 1 MG/ML IJ SOLN
0.5000 mg | INTRAMUSCULAR | Status: AC | PRN
  Administered 2024-09-06 (×2): 0.5 mg via INTRAVENOUS
  Filled 2024-09-06 (×2): qty 0.5

## 2024-09-06 MED ORDER — HYDROMORPHONE HCL 1 MG/ML IJ SOLN
0.5000 mg | INTRAMUSCULAR | Status: DC | PRN
  Administered 2024-09-06 – 2024-09-07 (×4): 0.5 mg via INTRAVENOUS
  Filled 2024-09-06 (×4): qty 0.5

## 2024-09-06 MED ORDER — CHLORHEXIDINE GLUCONATE 4 % EX SOLN
60.0000 mL | Freq: Once | CUTANEOUS | Status: AC
  Administered 2024-09-07: 4 via TOPICAL
  Filled 2024-09-06: qty 60

## 2024-09-06 NOTE — Progress Notes (Signed)
 Patient ID: Gabriel Caldwell, male   DOB: [birth year], 58 y.o.   MRN: [id number] The patient is in bed right now eating lunch.  His pain is adequately controlled.  I explained in detail what the surgery involves and even showed him pictures of her right hip replacement.  He understands that he does have an impacted right hip femoral neck fracture with pre-existing right hip arthritis.  We are recommending a total hip arthroplasty.  He is scheduled for surgery tomorrow.  The risks and benefits of surgery been discussed.  He we will be n.p.o. after midnight tonight.  Vital signs are stable and labs are stable.

## 2024-09-06 NOTE — Anesthesia Postprocedure Evaluation (Signed)
 Anesthesia Post Note  Patient: Gabriel Caldwell  Procedure(s) Performed: AN AD HOC NERVE BLOCK     Patient location during evaluation: PACU Anesthesia Type: Regional Level of consciousness: awake and alert Pain management: pain level controlled Vital Signs Assessment: post-procedure vital signs reviewed and stable Respiratory status: spontaneous breathing and nonlabored ventilation Cardiovascular status: blood pressure returned to baseline Postop Assessment: no headache Anesthetic complications: no   No notable events documented.  Last Vitals:  Vitals:   09/06/24 0342 09/06/24 0815  BP: 123/85 (!) 144/92  Pulse: 90 84  Resp: 18 18  Temp: 36.7 C 36.8 C  SpO2: 96% 93%    Last Pain:  Vitals:   09/06/24 0837  TempSrc:   PainSc: 5                  Lynwood MARLA Cornea
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
## 2024-09-06 NOTE — H&P (View-Only) (Signed)
 Patient ID: Gabriel Caldwell, male   DOB: [birth year], 58 y.o.   MRN: [id number] The patient is in bed right now eating lunch.  His pain is adequately controlled.  I explained in detail what the surgery involves and even showed him pictures of her right hip replacement.  He understands that he does have an impacted right hip femoral neck fracture with pre-existing right hip arthritis.  We are recommending a total hip arthroplasty.  He is scheduled for surgery tomorrow.  The risks and benefits of surgery been discussed.  He we will be n.p.o. after midnight tonight.  Vital signs are stable and labs are stable.

## 2024-09-07 ENCOUNTER — Encounter (HOSPITAL_COMMUNITY): Payer: Self-pay | Admitting: Internal Medicine

## 2024-09-07 ENCOUNTER — Inpatient Hospital Stay (HOSPITAL_COMMUNITY)

## 2024-09-07 ENCOUNTER — Encounter (HOSPITAL_COMMUNITY)
Admission: EM | Disposition: A | Discharge status: Home / Self Care | Payer: Self-pay | Source: Home / Self Care | Attending: Family Medicine

## 2024-09-07 ENCOUNTER — Inpatient Hospital Stay (HOSPITAL_COMMUNITY): Payer: Self-pay | Admitting: Anesthesiology

## 2024-09-07 DIAGNOSIS — S728X1A Other fracture of right femur, initial encounter for closed fracture: Secondary | ICD-10-CM

## 2024-09-07 DIAGNOSIS — M1611 Unilateral primary osteoarthritis, right hip: Secondary | ICD-10-CM | POA: Diagnosis not present

## 2024-09-07 HISTORY — PX: TOTAL HIP ARTHROPLASTY: SHX124

## 2024-09-07 LAB — ABO/RH: ABO/RH(D): A POS

## 2024-09-07 SURGERY — ARTHROPLASTY, HIP, TOTAL, ANTERIOR APPROACH
Anesthesia: General | Site: Hip | Laterality: Right

## 2024-09-07 MED ORDER — ASPIRIN 81 MG PO CHEW
81.0000 mg | CHEWABLE_TABLET | Freq: Two times a day (BID) | ORAL | Status: DC
  Administered 2024-09-07 – 2024-09-09 (×4): 81 mg via ORAL
  Filled 2024-09-07 (×4): qty 1

## 2024-09-07 MED ORDER — MIDAZOLAM HCL (PF) 2 MG/2ML IJ SOLN
INTRAMUSCULAR | Status: DC | PRN
  Administered 2024-09-07: 1 mg via INTRAVENOUS

## 2024-09-07 MED ORDER — METHOCARBAMOL 1000 MG/10ML IJ SOLN
INTRAMUSCULAR | Status: AC
  Filled 2024-09-07: qty 10

## 2024-09-07 MED ORDER — TRANEXAMIC ACID-NACL 1000-0.7 MG/100ML-% IV SOLN
1000.0000 mg | INTRAVENOUS | Status: AC
  Administered 2024-09-07: 1000 mg via INTRAVENOUS

## 2024-09-07 MED ORDER — CEFAZOLIN SODIUM-DEXTROSE 2-4 GM/100ML-% IV SOLN
2.0000 g | INTRAVENOUS | Status: AC
  Administered 2024-09-07: 2 g via INTRAVENOUS

## 2024-09-07 MED ORDER — HYDROMORPHONE HCL 1 MG/ML IJ SOLN
INTRAMUSCULAR | Status: AC
  Filled 2024-09-07: qty 1

## 2024-09-07 MED ORDER — LACTATED RINGERS IV SOLN: INTRAVENOUS | Status: DC

## 2024-09-07 MED ORDER — HYDROMORPHONE HCL 1 MG/ML IJ SOLN
0.2500 mg | INTRAMUSCULAR | Status: DC | PRN
  Administered 2024-09-07 (×3): 0.5 mg via INTRAVENOUS

## 2024-09-07 MED ORDER — CEFAZOLIN SODIUM-DEXTROSE 2-4 GM/100ML-% IV SOLN
INTRAVENOUS | Status: AC
  Filled 2024-09-07: qty 100

## 2024-09-07 MED ORDER — ONDANSETRON HCL 4 MG/2ML IJ SOLN
INTRAMUSCULAR | Status: DC | PRN
  Administered 2024-09-07: 4 mg via INTRAVENOUS

## 2024-09-07 MED ORDER — CHLORHEXIDINE GLUCONATE 0.12 % MT SOLN: 15.0000 mL | Freq: Once | OROMUCOSAL | Status: AC

## 2024-09-07 MED ORDER — SODIUM CHLORIDE 0.9 % IV SOLN: INTRAVENOUS | Status: DC

## 2024-09-07 MED ORDER — ORAL CARE MOUTH RINSE
15.0000 mL | Freq: Once | OROMUCOSAL | Status: AC
Start: 2024-09-07 — End: 2024-09-07

## 2024-09-07 MED ORDER — PROPOFOL 10 MG/ML IV BOLUS
INTRAVENOUS | Status: AC
Start: 2024-09-07 — End: 2024-09-07
  Filled 2024-09-07: qty 20

## 2024-09-07 MED ORDER — ACETAMINOPHEN 500 MG PO TABS: 1000.0000 mg | ORAL_TABLET | Freq: Once | ORAL | Status: AC

## 2024-09-07 MED ORDER — TIZANIDINE HCL 4 MG PO TABS
4.0000 mg | ORAL_TABLET | Freq: Four times a day (QID) | ORAL | Status: DC | PRN
  Administered 2024-09-07 – 2024-09-09 (×3): 4 mg via ORAL
  Filled 2024-09-07 (×3): qty 1

## 2024-09-07 MED ORDER — MELATONIN 5 MG PO TABS
5.0000 mg | ORAL_TABLET | Freq: Every evening | ORAL | Status: DC | PRN
  Administered 2024-09-07: 5 mg via ORAL
  Filled 2024-09-07: qty 1

## 2024-09-07 MED ORDER — PROPOFOL 10 MG/ML IV BOLUS
INTRAVENOUS | Status: AC
  Filled 2024-09-07: qty 20

## 2024-09-07 MED ORDER — TRANEXAMIC ACID-NACL 1000-0.7 MG/100ML-% IV SOLN
INTRAVENOUS | Status: AC
  Filled 2024-09-07: qty 100

## 2024-09-07 MED ORDER — PROPOFOL 10 MG/ML IV BOLUS
INTRAVENOUS | Status: DC | PRN
  Administered 2024-09-07: 150 mg via INTRAVENOUS

## 2024-09-07 MED ORDER — HYDROMORPHONE HCL 1 MG/ML IJ SOLN
0.5000 mg | INTRAMUSCULAR | Status: DC | PRN
  Administered 2024-09-07 – 2024-09-09 (×9): 1 mg via INTRAVENOUS
  Filled 2024-09-07 (×9): qty 1

## 2024-09-07 MED ORDER — KETOROLAC TROMETHAMINE 15 MG/ML IJ SOLN
7.5000 mg | Freq: Four times a day (QID) | INTRAMUSCULAR | Status: AC
  Administered 2024-09-07 – 2024-09-08 (×3): 7.5 mg via INTRAVENOUS
  Filled 2024-09-07 (×3): qty 1

## 2024-09-07 MED ORDER — DOCUSATE SODIUM 100 MG PO CAPS
100.0000 mg | ORAL_CAPSULE | Freq: Two times a day (BID) | ORAL | Status: DC
  Administered 2024-09-07 – 2024-09-09 (×5): 100 mg via ORAL
  Filled 2024-09-07 (×5): qty 1

## 2024-09-07 MED ORDER — FENTANYL CITRATE (PF) 100 MCG/2ML IJ SOLN
INTRAMUSCULAR | Status: AC
  Filled 2024-09-07: qty 2

## 2024-09-07 MED ORDER — 0.9 % SODIUM CHLORIDE (POUR BTL) OPTIME
TOPICAL | Status: DC | PRN
  Administered 2024-09-07: 1000 mL

## 2024-09-07 MED ORDER — SUGAMMADEX SODIUM 200 MG/2ML IV SOLN
INTRAVENOUS | Status: DC | PRN
  Administered 2024-09-07: 300 mg via INTRAVENOUS

## 2024-09-07 MED ORDER — OXYCODONE HCL 5 MG PO TABS: 5.0000 mg | ORAL_TABLET | Freq: Once | ORAL | Status: DC | PRN

## 2024-09-07 MED ORDER — DIPHENHYDRAMINE HCL 12.5 MG/5ML PO ELIX
12.5000 mg | ORAL_SOLUTION | ORAL | Status: DC | PRN
  Administered 2024-09-08: 25 mg via ORAL
  Filled 2024-09-07: qty 10

## 2024-09-07 MED ORDER — PANTOPRAZOLE SODIUM 40 MG PO TBEC
40.0000 mg | DELAYED_RELEASE_TABLET | Freq: Every day | ORAL | Status: DC
  Administered 2024-09-07 – 2024-09-09 (×3): 40 mg via ORAL
  Filled 2024-09-07 (×3): qty 1

## 2024-09-07 MED ORDER — ACETAMINOPHEN 325 MG PO TABS: 325.0000 mg | ORAL_TABLET | Freq: Four times a day (QID) | ORAL | Status: DC | PRN

## 2024-09-07 MED ORDER — PHENOL 1.4 % MT LIQD: 1.0000 | OROMUCOSAL | Status: DC | PRN

## 2024-09-07 MED ORDER — OXYCODONE HCL 5 MG/5ML PO SOLN: 5.0000 mg | Freq: Once | ORAL | Status: DC | PRN

## 2024-09-07 MED ORDER — MIDAZOLAM HCL 2 MG/2ML IJ SOLN
INTRAMUSCULAR | Status: AC
  Filled 2024-09-07: qty 2

## 2024-09-07 MED ORDER — AMISULPRIDE (ANTIEMETIC) 5 MG/2ML IV SOLN
10.0000 mg | Freq: Once | INTRAVENOUS | Status: DC | PRN
Start: 2024-09-07 — End: 2024-09-07

## 2024-09-07 MED ORDER — METOCLOPRAMIDE HCL 5 MG PO TABS: 5.0000 mg | ORAL_TABLET | Freq: Three times a day (TID) | ORAL | Status: DC | PRN

## 2024-09-07 MED ORDER — STERILE WATER FOR IRRIGATION IR SOLN
Status: DC | PRN
  Administered 2024-09-07: 1000 mL

## 2024-09-07 MED ORDER — CHLORHEXIDINE GLUCONATE 0.12 % MT SOLN
OROMUCOSAL | Status: AC
  Administered 2024-09-07: 15 mL via OROMUCOSAL
  Filled 2024-09-07: qty 15

## 2024-09-07 MED ORDER — DIPHENHYDRAMINE HCL 25 MG PO CAPS
25.0000 mg | ORAL_CAPSULE | Freq: Every evening | ORAL | Status: DC | PRN
  Administered 2024-09-07: 25 mg via ORAL
  Filled 2024-09-07: qty 1

## 2024-09-07 MED ORDER — SODIUM CHLORIDE 0.9 % IR SOLN
Status: DC | PRN
  Administered 2024-09-07: 1000 mL

## 2024-09-07 MED ORDER — ACETAMINOPHEN 500 MG PO TABS
ORAL_TABLET | ORAL | Status: AC
Start: 2024-09-07 — End: 2024-09-07
  Administered 2024-09-07: 1000 mg via ORAL
  Filled 2024-09-07: qty 2

## 2024-09-07 MED ORDER — DEXMEDETOMIDINE HCL IN NACL 80 MCG/20ML IV SOLN
INTRAVENOUS | Status: DC | PRN
  Administered 2024-09-07 (×2): 8 ug via INTRAVENOUS

## 2024-09-07 MED ORDER — MENTHOL 3 MG MT LOZG: 1.0000 | LOZENGE | OROMUCOSAL | Status: DC | PRN

## 2024-09-07 MED ORDER — CEFAZOLIN SODIUM-DEXTROSE 2-4 GM/100ML-% IV SOLN
2.0000 g | Freq: Four times a day (QID) | INTRAVENOUS | Status: AC
  Administered 2024-09-07 (×2): 2 g via INTRAVENOUS
  Filled 2024-09-07 (×2): qty 100

## 2024-09-07 MED ORDER — FENTANYL CITRATE (PF) 250 MCG/5ML IJ SOLN
INTRAMUSCULAR | Status: DC | PRN
  Administered 2024-09-07 (×4): 50 ug via INTRAVENOUS

## 2024-09-07 MED ORDER — DEXAMETHASONE SOD PHOSPHATE PF 10 MG/ML IJ SOLN
INTRAMUSCULAR | Status: DC | PRN
  Administered 2024-09-07: 5 mg via INTRAVENOUS

## 2024-09-07 MED ORDER — POVIDONE-IODINE 10 % EX SWAB
2.0000 | Freq: Once | CUTANEOUS | Status: AC
  Administered 2024-09-07: 2 via TOPICAL

## 2024-09-07 MED ORDER — OXYCODONE HCL 5 MG PO TABS
5.0000 mg | ORAL_TABLET | ORAL | Status: DC | PRN
  Administered 2024-09-08: 10 mg via ORAL
  Filled 2024-09-07: qty 2

## 2024-09-07 MED ORDER — OXYCODONE HCL 5 MG PO TABS
10.0000 mg | ORAL_TABLET | ORAL | Status: DC | PRN
  Administered 2024-09-07 – 2024-09-09 (×5): 15 mg via ORAL
  Administered 2024-09-09: 10 mg via ORAL
  Administered 2024-09-09: 15 mg via ORAL
  Filled 2024-09-07 (×2): qty 3
  Filled 2024-09-07: qty 2
  Filled 2024-09-07 (×4): qty 3

## 2024-09-07 MED ORDER — MIDAZOLAM HCL (PF) 2 MG/2ML IJ SOLN: 2.0000 mg | Freq: Once | INTRAMUSCULAR | Status: DC

## 2024-09-07 MED ORDER — ROCURONIUM BROMIDE 10 MG/ML (PF) SYRINGE
PREFILLED_SYRINGE | INTRAVENOUS | Status: DC | PRN
  Administered 2024-09-07: 60 mg via INTRAVENOUS

## 2024-09-07 MED ORDER — ALUM & MAG HYDROXIDE-SIMETH 200-200-20 MG/5ML PO SUSP: 30.0000 mL | ORAL | Status: DC | PRN

## 2024-09-07 MED ORDER — LIDOCAINE 2% (20 MG/ML) 5 ML SYRINGE
INTRAMUSCULAR | Status: DC | PRN
  Administered 2024-09-07: 100 mg via INTRAVENOUS

## 2024-09-07 MED ORDER — METOCLOPRAMIDE HCL 5 MG/ML IJ SOLN: 5.0000 mg | Freq: Three times a day (TID) | INTRAMUSCULAR | Status: DC | PRN

## 2024-09-07 SURGICAL SUPPLY — 42 items
BAG COUNTER SPONGE SURGICOUNT (BAG) ×1 IMPLANT
BENZOIN TINCTURE PRP APPL 2/3 (GAUZE/BANDAGES/DRESSINGS) ×1 IMPLANT
BLADE CLIPPER SURG (BLADE) IMPLANT
BLADE SAW SGTL 18X1.27X75 (BLADE) ×1 IMPLANT
COVER SURGICAL LIGHT HANDLE (MISCELLANEOUS) ×1 IMPLANT
CUP ACETAB W/GRIPTION 54 (Plate) IMPLANT
DRAPE C-ARM 42X72 X-RAY (DRAPES) ×1 IMPLANT
DRAPE STERI IOBAN 125X83 (DRAPES) ×1 IMPLANT
DRAPE U-SHAPE 47X51 STRL (DRAPES) ×3 IMPLANT
DRSG AQUACEL AG ADV 3.5X10 (GAUZE/BANDAGES/DRESSINGS) ×1 IMPLANT
DURAPREP 26ML APPLICATOR (WOUND CARE) ×1 IMPLANT
ELECT BLADE 6.5 EXT (BLADE) IMPLANT
ELECT PENCIL ROCKER SW 15FT (MISCELLANEOUS) IMPLANT
ELECTRODE BLDE 4.0 EZ CLN MEGD (MISCELLANEOUS) ×1 IMPLANT
ELECTRODE REM PT RTRN 9FT ADLT (ELECTROSURGICAL) ×1 IMPLANT
FACESHIELD WRAPAROUND OR TEAM (MASK) ×2 IMPLANT
GLOVE BIOGEL PI IND STRL 8 (GLOVE) ×2 IMPLANT
GLOVE ECLIPSE 8.0 STRL XLNG CF (GLOVE) ×1 IMPLANT
GLOVE ORTHO TXT STRL SZ7.5 (GLOVE) ×2 IMPLANT
GOWN STRL REUS W/ TWL LRG LVL3 (GOWN DISPOSABLE) ×2 IMPLANT
GOWN STRL REUS W/ TWL XL LVL3 (GOWN DISPOSABLE) ×2 IMPLANT
HEAD CERAMIC DELTA 36 PLUS 1.5 (Hips) IMPLANT
KIT BASIN OR (CUSTOM PROCEDURE TRAY) ×1 IMPLANT
KIT TURNOVER KIT B (KITS) ×1 IMPLANT
LINER NEUTRAL 54X36MM PLUS 4 (Hips) IMPLANT
MANIFOLD NEPTUNE II (INSTRUMENTS) ×1 IMPLANT
PACK TOTAL JOINT (CUSTOM PROCEDURE TRAY) ×1 IMPLANT
PAD ARMBOARD POSITIONER FOAM (MISCELLANEOUS) ×1 IMPLANT
SET HNDPC FAN SPRY TIP SCT (DISPOSABLE) ×1 IMPLANT
SOLN 0.9% NACL POUR BTL 1000ML (IV SOLUTION) ×1 IMPLANT
SOLN STERILE WATER BTL 1000 ML (IV SOLUTION) ×2 IMPLANT
STAPLER SKIN PROX 35W (STAPLE) IMPLANT
STEM FEM ACTIS HIGH SZ7 (Stem) IMPLANT
STRIP CLOSURE SKIN 1/2X4 (GAUZE/BANDAGES/DRESSINGS) ×2 IMPLANT
SUT ETHIBOND NAB CT1 #1 30IN (SUTURE) ×1 IMPLANT
SUT MNCRL AB 4-0 PS2 18 (SUTURE) IMPLANT
SUT VIC AB 0 CT1 27XBRD ANBCTR (SUTURE) ×1 IMPLANT
SUT VIC AB 1 CT1 27XBRD ANBCTR (SUTURE) ×1 IMPLANT
SUT VIC AB 2-0 CT1 TAPERPNT 27 (SUTURE) ×1 IMPLANT
TOWEL GREEN STERILE (TOWEL DISPOSABLE) ×1 IMPLANT
TOWEL GREEN STERILE FF (TOWEL DISPOSABLE) ×1 IMPLANT
TRAY FOLEY W/BAG SLVR 16FR ST (SET/KITS/TRAYS/PACK) IMPLANT

## 2024-09-07 NOTE — Anesthesia Postprocedure Evaluation (Signed)
 Anesthesia Post Note  Patient: Gabriel Caldwell  Procedure(s) Performed: ARTHROPLASTY, HIP, TOTAL, ANTERIOR APPROACH (Right: Hip)     Patient location during evaluation: PACU Anesthesia Type: General Level of consciousness: awake Pain management: pain level controlled Vital Signs Assessment: post-procedure vital signs reviewed and stable Respiratory status: spontaneous breathing, nonlabored ventilation and respiratory function stable Cardiovascular status: blood pressure returned to baseline and stable Postop Assessment: no apparent nausea or vomiting Anesthetic complications: no   No notable events documented.  Last Vitals:  Vitals:   09/07/24 1000 09/07/24 1017  BP: (!) 112/97 123/80  Pulse: 85 79  Resp: 20 17  Temp: 36.5 C 36.5 C  SpO2: 95% 94%    Last Pain:  Vitals:   09/07/24 1017  TempSrc: Oral  PainSc:                  Gabriel Caldwell

## 2024-09-07 NOTE — Discharge Instructions (Signed)

## 2024-09-07 NOTE — Transfer of Care (Signed)
 Immediate Anesthesia Transfer of Care Note  Patient: Gabriel Caldwell  Procedure(s) Performed: ARTHROPLASTY, HIP, TOTAL, ANTERIOR APPROACH (Right: Hip)  Patient Location: PACU  Anesthesia Type:General  Level of Consciousness: drowsy and lethargic  Airway & Oxygen Therapy: Patient Spontanous Breathing and Patient connected to face mask oxygen  Post-op Assessment: Report given to RN and Post -op Vital signs reviewed and stable  Post vital signs: Reviewed and stable  Last Vitals:  Vitals Value Taken Time  BP 115/81 09/07/24 09:15  Temp    Pulse 94 09/07/24 09:16  Resp 21 09/07/24 09:16  SpO2 93 % 09/07/24 09:16  Vitals shown include unfiled device data.  Last Pain:  Vitals:   09/07/24 9367  TempSrc: Oral  PainSc:          Complications: No notable events documented.
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
## 2024-09-07 NOTE — Progress Notes (Signed)
 Report given to short stay and CRNA charge.
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
## 2024-09-07 NOTE — Plan of Care (Signed)
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
## 2024-09-07 NOTE — Anesthesia Procedure Notes (Addendum)
 Procedure Name: Intubation Date/Time: 09/07/2024 7:40 AM  Performed by: Virgil Ee, CRNAPre-anesthesia Checklist: Patient identified, Patient being monitored, Timeout performed, Emergency Drugs available and Suction available Patient Re-evaluated:Patient Re-evaluated prior to induction Oxygen Delivery Method: Circle system utilized Preoxygenation: Pre-oxygenation with 100% oxygen Induction Type: IV induction Ventilation: Mask ventilation without difficulty Laryngoscope Size: Mac and 4 Grade View: Grade I Tube type: Oral Tube size: 7.5 mm Number of attempts: 1 Airway Equipment and Method: Stylet Placement Confirmation: ETT inserted through vocal cords under direct vision, positive ETCO2 and breath sounds checked- equal and bilateral Secured at: 23 cm Tube secured with: Tape Dental Injury: Teeth and Oropharynx as per pre-operative assessment

## 2024-09-08 DIAGNOSIS — S728X1A Other fracture of right femur, initial encounter for closed fracture: Secondary | ICD-10-CM | POA: Diagnosis not present

## 2024-09-08 LAB — CBC
HCT: 42.3 % (ref 39.0–52.0)
Hemoglobin: 14.1 g/dL (ref 13.0–17.0)
MCH: 31 pg (ref 26.0–34.0)
MCHC: 33.3 g/dL (ref 30.0–36.0)
MCV: 93 fL (ref 80.0–100.0)
Platelets: 274 K/uL (ref 150–400)
RBC: 4.55 MIL/uL (ref 4.22–5.81)
RDW: 13.3 % (ref 11.5–15.5)
WBC: 14.4 K/uL — ABNORMAL HIGH (ref 4.0–10.5)
nRBC: 0 % (ref 0.0–0.2)

## 2024-09-08 NOTE — Progress Notes (Signed)
   09/08/24 1027  Spiritual Encounters  Type of Visit Initial  Care provided to: Patient  Referral source Clinical staff  Reason for visit Routine spiritual support  OnCall Visit No  Spiritual Framework  Presenting Themes Impactful experiences and emotions;Meaning/purpose/sources of inspiration  Community/Connection Significant other  Patient Stress Factors Health changes  Family Stress Factors None identified  Interventions  Spiritual Care Interventions Made Compassionate presence;Established relationship of care and support;Prayer  Intervention Outcomes  Outcomes Awareness of health;Awareness of support;Awareness around self/spiritual resourses   Chaplain met with Pt during regular rounding.Chaplain offered trauma-informed presence, reflective listening, and  spiritual/ emotional support. Provided safe space for the Pt to process emotions without correction or judgment.
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
## 2024-09-08 NOTE — Plan of Care (Signed)
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
## 2024-09-09 ENCOUNTER — Other Ambulatory Visit: Payer: Self-pay | Admitting: Orthopaedic Surgery

## 2024-09-09 DIAGNOSIS — S728X1A Other fracture of right femur, initial encounter for closed fracture: Secondary | ICD-10-CM | POA: Diagnosis not present

## 2024-09-09 LAB — BASIC METABOLIC PANEL WITH GFR
Anion gap: 11 (ref 5–15)
BUN: 13 mg/dL (ref 6–20)
CO2: 24 mmol/L (ref 22–32)
Calcium: 8.3 mg/dL — ABNORMAL LOW (ref 8.9–10.3)
Chloride: 101 mmol/L (ref 98–111)
Creatinine, Ser: 0.88 mg/dL (ref 0.61–1.24)
GFR, Estimated: >60 mL/min (ref >60)
Glucose, Bld: 83 mg/dL (ref 70–99)
Potassium: 4.5 mmol/L (ref 3.5–5.1)
Sodium: 136 mmol/L (ref 135–145)

## 2024-09-09 LAB — CBC
HCT: 40.3 % (ref 39.0–52.0)
Hemoglobin: 13.5 g/dL (ref 13.0–17.0)
MCH: 31.2 pg (ref 26.0–34.0)
MCHC: 33.5 g/dL (ref 30.0–36.0)
MCV: 93.1 fL (ref 80.0–100.0)
Platelets: 269 K/uL (ref 150–400)
RBC: 4.33 MIL/uL (ref 4.22–5.81)
RDW: 13.5 % (ref 11.5–15.5)
WBC: 11.5 K/uL — ABNORMAL HIGH (ref 4.0–10.5)
nRBC: 0 % (ref 0.0–0.2)

## 2024-09-09 MED ORDER — IBUPROFEN 200 MG PO TABS: 400.0000 mg | ORAL_TABLET | Freq: Four times a day (QID) | ORAL | Status: DC | PRN

## 2024-09-09 MED ORDER — ASPIRIN 81 MG PO CHEW: 81.0000 mg | CHEWABLE_TABLET | Freq: Two times a day (BID) | ORAL | 0 refills | Status: AC

## 2024-09-09 MED ORDER — SMOG ENEMA
960.0000 mL | Freq: Once | RECTAL | Status: DC
  Filled 2024-09-09 (×2): qty 960

## 2024-09-09 MED ORDER — TIZANIDINE HCL 4 MG PO TABS: 4.0000 mg | ORAL_TABLET | Freq: Four times a day (QID) | ORAL | 0 refills | Status: AC | PRN

## 2024-09-09 MED ORDER — OXYCODONE HCL 5 MG PO TABS
5.0000 mg | ORAL_TABLET | ORAL | 0 refills | Status: AC | PRN
Start: 2024-09-09 — End: ?

## 2024-09-09 NOTE — Plan of Care (Signed)
  Problem: Health Behavior/Discharge Planning: Goal: Ability to manage health-related needs will improve Outcome: Progressing   Problem: Activity: Goal: Risk for activity intolerance will decrease Outcome: Progressing   Problem: Nutrition: Goal: Adequate nutrition will be maintained Outcome: Progressing   

## 2024-09-09 NOTE — Plan of Care (Signed)
  Problem: Education: Goal: Knowledge of General Education information will improve Description: Including pain rating scale, medication(s)/side effects and non-pharmacologic comfort measures Outcome: Progressing   Problem: Clinical Measurements: Goal: Ability to maintain clinical measurements within normal limits will improve Outcome: Progressing Goal: Cardiovascular complication will be avoided Outcome: Progressing   Problem: Activity: Goal: Risk for activity intolerance will decrease Outcome: Progressing   Problem: Elimination: Goal: Will not experience complications related to urinary retention Outcome: Progressing

## 2024-09-09 NOTE — Progress Notes (Signed)
 Pain medication (oxycodone ) as well as Zanaflex and a baby aspirin twice daily has been sent in to the [facility] listed in his epic chart.
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
## 2024-09-09 NOTE — TOC Transition Note (Addendum)
 Transition of Care [facility]) - Discharge Note   Patient Details  Name: Gabriel Caldwell MRN: [id number] Date of Birth: [birth year]  Transition [facility]) CM/SW Contact:  Robynn Eileen Hoose, RN Phone Number: 09/09/2024, 11:58 AM   Clinical Narrative:   Patient is being discharged today. Spoke with patient, confirmed he is able to get to outpatient Physical therapy office.  DME 3:1 and RW ordered through Jermaine with [employer]. DME to be delivered to patient bedside before discharging home. Patient confirmed that he will be staying with a friend at [address] [place], [place]. Friend will pick patient up after they get off from work.  No HH agency to accept pt for [facility] services at this time. Outpatient physical and occupational therapy referral # [id number] sent to [facility]. Contact information on AVS.    Final next level of care: Home/Self Care Barriers to Discharge: No Barriers Identified   Patient Goals and CMS Choice            Discharge Placement                       Discharge Plan and Services Additional resources added to the After Visit Summary for                  DME Arranged: 3-N-1, Walker rolling DME Agency: [employer] Date DME Agency Contacted: 09/09/24 Time DME Agency Contacted: 1152 Representative spoke with at DME Agency: [place]            Social Drivers of Health (SDOH) Interventions SDOH Screenings   Food Insecurity: No Food Insecurity (09/05/2024)  Housing: Low Risk  (09/05/2024)  Transportation Needs: No Transportation Needs (09/05/2024)  Utilities: Not At Risk (09/05/2024)  Tobacco Use: High Risk (09/07/2024)     Readmission Risk Interventions     No data to display

## 2024-09-09 NOTE — Progress Notes (Signed)
 Patient ID: Gabriel Caldwell, male   DOB: 1966/09/13, 58 y.o.   MRN: [id number]

## 2024-09-09 NOTE — Progress Notes (Signed)
 Patient has received his discharge papers and now is waiting for friend to pick him. Patient stated that friend will come to hospital around [facility].
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
## 2024-09-15 ENCOUNTER — Telehealth: Payer: Self-pay | Admitting: Orthopaedic Surgery

## 2024-09-15 NOTE — Telephone Encounter (Signed)
 Pt called needing to make a f/u appt to get his staples out. The first thing avalibale is 12/3 with Gretta and 12/10 with Vernetta. Pt call back number is [phone]

## 2024-09-15 NOTE — Telephone Encounter (Signed)
 LMOM for patient asking if he can come the morning of the 26th and be on [facility] schedule that morning

## 2024-09-27 ENCOUNTER — Encounter: Payer: Self-pay | Admitting: Physician Assistant

## 2024-09-27 ENCOUNTER — Ambulatory Visit (INDEPENDENT_AMBULATORY_CARE_PROVIDER_SITE_OTHER): Admitting: Physician Assistant

## 2024-09-27 ENCOUNTER — Encounter: Payer: Self-pay | Admitting: Radiology

## 2024-09-27 DIAGNOSIS — Z96641 Presence of right artificial hip joint: Secondary | ICD-10-CM

## 2024-09-27 MED ORDER — IBUPROFEN 800 MG PO TABS: 800.0000 mg | ORAL_TABLET | Freq: Three times a day (TID) | ORAL | 1 refills | Status: AC | PRN
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
## 2024-10-09 ENCOUNTER — Encounter: Admitting: Orthopaedic Surgery

## 2024-10-25 ENCOUNTER — Ambulatory Visit: Admitting: Orthopaedic Surgery

## 2024-10-25 ENCOUNTER — Encounter: Payer: Self-pay | Admitting: Orthopaedic Surgery

## 2024-10-25 DIAGNOSIS — Z96641 Presence of right artificial hip joint: Secondary | ICD-10-CM

## 2024-10-25 MED ORDER — GABAPENTIN 300 MG PO CAPS: 300.0000 mg | ORAL_CAPSULE | Freq: Every evening | ORAL | 0 refills | Status: AC | PRN

## 2024-10-25 MED ORDER — AMOXICILLIN 875 MG PO TABS: 875.0000 mg | ORAL_TABLET | Freq: Two times a day (BID) | ORAL | 0 refills | Status: AC
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
## 2025-01-24 ENCOUNTER — Ambulatory Visit: Admitting: Orthopaedic Surgery
